# Patient Record
Sex: Male | Born: 1945
Health system: Southern US, Community
[De-identification: ages and names within clinical notes are randomized; demographics above are authoritative.]

## PROBLEM LIST (undated history)

## (undated) DIAGNOSIS — H269 Unspecified cataract: Secondary | ICD-10-CM

## (undated) DIAGNOSIS — E042 Nontoxic multinodular goiter: Secondary | ICD-10-CM

## (undated) DIAGNOSIS — M25571 Pain in right ankle and joints of right foot: Secondary | ICD-10-CM

## (undated) DIAGNOSIS — K219 Gastro-esophageal reflux disease without esophagitis: Secondary | ICD-10-CM

## (undated) DIAGNOSIS — N138 Other obstructive and reflux uropathy: Secondary | ICD-10-CM

## (undated) DIAGNOSIS — M1990 Unspecified osteoarthritis, unspecified site: Secondary | ICD-10-CM

## (undated) DIAGNOSIS — R079 Chest pain, unspecified: Secondary | ICD-10-CM

## (undated) DIAGNOSIS — I10 Essential (primary) hypertension: Secondary | ICD-10-CM

## (undated) DIAGNOSIS — H409 Unspecified glaucoma: Secondary | ICD-10-CM

## (undated) DIAGNOSIS — E785 Hyperlipidemia, unspecified: Secondary | ICD-10-CM

## (undated) DIAGNOSIS — N400 Enlarged prostate without lower urinary tract symptoms: Secondary | ICD-10-CM

## (undated) DIAGNOSIS — H52209 Unspecified astigmatism, unspecified eye: Secondary | ICD-10-CM

## (undated) DIAGNOSIS — M542 Cervicalgia: Secondary | ICD-10-CM

## (undated) DIAGNOSIS — I639 Cerebral infarction, unspecified: Secondary | ICD-10-CM

## (undated) DIAGNOSIS — G459 Transient cerebral ischemic attack, unspecified: Secondary | ICD-10-CM

## (undated) DIAGNOSIS — M25519 Pain in unspecified shoulder: Secondary | ICD-10-CM

## (undated) DIAGNOSIS — K2100 Gastro-esophageal reflux disease with esophagitis, without bleeding: Secondary | ICD-10-CM

## (undated) DIAGNOSIS — E049 Nontoxic goiter, unspecified: Secondary | ICD-10-CM

## (undated) HISTORY — DX: Gastro-esophageal reflux disease with esophagitis, without bleeding: K21.00

## (undated) HISTORY — DX: Pain in unspecified shoulder: M25.519

## (undated) HISTORY — DX: Nontoxic multinodular goiter: E04.2

## (undated) HISTORY — DX: Essential (primary) hypertension: I10

## (undated) HISTORY — DX: Pain in right ankle and joints of right foot: M25.571

## (undated) HISTORY — DX: Gastro-esophageal reflux disease without esophagitis: K21.9

## (undated) HISTORY — DX: Hyperlipidemia, unspecified: E78.5

## (undated) HISTORY — PX: APPENDECTOMY: SHX54

## (undated) HISTORY — PX: DENTAL SURGERY: SHX609

## (undated) HISTORY — DX: Unspecified glaucoma: H40.9

## (undated) HISTORY — DX: Cervicalgia: M54.2

## (undated) HISTORY — DX: Other obstructive and reflux uropathy: N13.8

## (undated) HISTORY — DX: Unspecified osteoarthritis, unspecified site: M19.90

## (undated) HISTORY — DX: Transient cerebral ischemic attack, unspecified: G45.9

## (undated) HISTORY — DX: Chest pain, unspecified: R07.9

---

## 2011-11-16 DIAGNOSIS — H5200 Hypermetropia, unspecified eye: Secondary | ICD-10-CM | POA: Diagnosis not present

## 2011-11-16 DIAGNOSIS — H40019 Open angle with borderline findings, low risk, unspecified eye: Secondary | ICD-10-CM | POA: Diagnosis not present

## 2011-11-16 DIAGNOSIS — H25019 Cortical age-related cataract, unspecified eye: Secondary | ICD-10-CM | POA: Diagnosis not present

## 2011-11-16 DIAGNOSIS — H52229 Regular astigmatism, unspecified eye: Secondary | ICD-10-CM | POA: Diagnosis not present

## 2011-11-29 ENCOUNTER — Ambulatory Visit (HOSPITAL_COMMUNITY)
Admission: RE | Admit: 2011-11-29 | Discharge: 2011-11-29 | Disposition: A | Discharge status: Home / Self Care | Payer: Medicare Other | Source: Ambulatory Visit | Attending: Pulmonary Disease | Admitting: Pulmonary Disease

## 2011-11-29 ENCOUNTER — Other Ambulatory Visit (HOSPITAL_COMMUNITY): Payer: Self-pay | Admitting: Pulmonary Disease

## 2011-11-29 DIAGNOSIS — M25859 Other specified joint disorders, unspecified hip: Secondary | ICD-10-CM | POA: Diagnosis not present

## 2011-11-29 DIAGNOSIS — M25559 Pain in unspecified hip: Secondary | ICD-10-CM | POA: Insufficient documentation

## 2011-11-29 DIAGNOSIS — M545 Low back pain, unspecified: Secondary | ICD-10-CM | POA: Diagnosis not present

## 2011-11-29 DIAGNOSIS — M47817 Spondylosis without myelopathy or radiculopathy, lumbosacral region: Secondary | ICD-10-CM | POA: Diagnosis not present

## 2011-11-29 DIAGNOSIS — M519 Unspecified thoracic, thoracolumbar and lumbosacral intervertebral disc disorder: Secondary | ICD-10-CM | POA: Diagnosis not present

## 2011-11-29 DIAGNOSIS — M169 Osteoarthritis of hip, unspecified: Secondary | ICD-10-CM | POA: Insufficient documentation

## 2011-11-29 DIAGNOSIS — M1610 Unilateral primary osteoarthritis, unspecified hip: Secondary | ICD-10-CM | POA: Insufficient documentation

## 2011-11-29 DIAGNOSIS — M549 Dorsalgia, unspecified: Secondary | ICD-10-CM

## 2012-07-05 DIAGNOSIS — I10 Essential (primary) hypertension: Secondary | ICD-10-CM | POA: Diagnosis not present

## 2012-07-05 DIAGNOSIS — E785 Hyperlipidemia, unspecified: Secondary | ICD-10-CM | POA: Diagnosis not present

## 2012-07-05 DIAGNOSIS — Z0000 Encounter for general adult medical examination without abnormal findings: Secondary | ICD-10-CM | POA: Diagnosis not present

## 2012-07-05 DIAGNOSIS — Z125 Encounter for screening for malignant neoplasm of prostate: Secondary | ICD-10-CM | POA: Diagnosis not present

## 2012-11-27 ENCOUNTER — Other Ambulatory Visit: Payer: Self-pay

## 2012-11-27 ENCOUNTER — Telehealth: Payer: Self-pay

## 2012-11-27 ENCOUNTER — Encounter (HOSPITAL_COMMUNITY): Payer: Self-pay | Admitting: Pharmacy Technician

## 2012-11-27 DIAGNOSIS — Z1211 Encounter for screening for malignant neoplasm of colon: Secondary | ICD-10-CM

## 2012-11-27 NOTE — Telephone Encounter (Signed)
Gastroenterology Pre-Procedure Form     Request Date: 11/27/2012      Requesting Physician: Dr. Juanetta Gosling     PATIENT INFORMATION:  Hector Welch is a 67 y.o., male (DOB=[birth year]).  PROCEDURE: Procedure(s) requested: colonoscopy Procedure Reason: screening for colon cancer  PATIENT REVIEW QUESTIONS: The patient reports the following:   1. Diabetes Melitis: no 2. Joint replacements in the past 12 months: no 3. Major health problems in the past 3 months: no 4. Has an artificial valve or MVP:no 5. Has been advised in past to take antibiotics in advance of a procedure like teeth cleaning: no}    MEDICATIONS & ALLERGIES:    Patient reports the following regarding taking any blood thinners:   Plavix? no Aspirin? YES Coumadin?  no  Patient confirms/reports the following medications:  Current Outpatient Prescriptions  Medication Sig Dispense Refill  . aspirin 81 MG tablet Take 81 mg by mouth daily.      . nebivolol (BYSTOLIC) 10 MG tablet Take 10 mg by mouth daily.       No current facility-administered medications for this visit.    Patient confirms/reports the following allergies:  No Known Allergies  Patient is appropriate to schedule for requested procedure(s): yes  AUTHORIZATION INFORMATION Primary Insurance:   ID #:   Group #:  Pre-Cert / Auth required:  Pre-Cert / Auth #:   Secondary Insurance:   ID #: Group #:  Pre-Cert / Auth required:  Pre-Cert / Auth #:   No orders of the defined types were placed in this encounter.    SCHEDULE INFORMATION: Procedure has been scheduled as follows:  Date:12/05/2012             Time:  1:00 PM Location: [facility] Short Stay  This Gastroenterology Pre-Precedure Form is being routed to the following provider(s) for review: R. Roetta Sessions, MD

## 2012-11-27 NOTE — Telephone Encounter (Signed)
OK to proceed with colonoscopy.

## 2012-11-28 MED ORDER — PEG-KCL-NACL-NASULF-NA ASC-C 100 G PO SOLR
1.0000 | ORAL | Status: DC
Start: 2012-11-28 — End: 2018-02-27

## 2012-11-28 NOTE — Telephone Encounter (Signed)
Rx sent to [facility] in [place]. Instructions mailed to pt.  

## 2012-11-29 NOTE — Telephone Encounter (Signed)
OK to proceed with colonoscopy.

## 2012-12-05 ENCOUNTER — Ambulatory Visit (HOSPITAL_COMMUNITY)
Admission: RE | Admit: 2012-12-05 | Discharge: 2012-12-05 | Disposition: A | Discharge status: Home / Self Care | Payer: Medicare Other | Source: Ambulatory Visit | Attending: Internal Medicine | Admitting: Internal Medicine

## 2012-12-05 ENCOUNTER — Encounter (HOSPITAL_COMMUNITY)
Admission: RE | Disposition: A | Discharge status: Home / Self Care | Payer: Self-pay | Source: Ambulatory Visit | Attending: Internal Medicine

## 2012-12-05 ENCOUNTER — Encounter (HOSPITAL_COMMUNITY): Payer: Self-pay | Admitting: *Deleted

## 2012-12-05 DIAGNOSIS — K648 Other hemorrhoids: Secondary | ICD-10-CM | POA: Diagnosis not present

## 2012-12-05 DIAGNOSIS — I10 Essential (primary) hypertension: Secondary | ICD-10-CM | POA: Diagnosis not present

## 2012-12-05 DIAGNOSIS — Z1211 Encounter for screening for malignant neoplasm of colon: Secondary | ICD-10-CM

## 2012-12-05 HISTORY — DX: Unspecified astigmatism, unspecified eye: H52.209

## 2012-12-05 HISTORY — PX: COLONOSCOPY: SHX5424

## 2012-12-05 HISTORY — DX: Essential (primary) hypertension: I10

## 2012-12-05 LAB — HM COLONOSCOPY

## 2012-12-05 SURGERY — COLONOSCOPY
Anesthesia: Moderate Sedation

## 2012-12-05 MED ORDER — MIDAZOLAM HCL 5 MG/5ML IJ SOLN
INTRAMUSCULAR | Status: AC
  Filled 2012-12-05: qty 10

## 2012-12-05 MED ORDER — MEPERIDINE HCL 100 MG/ML IJ SOLN
INTRAMUSCULAR | Status: DC | PRN
  Administered 2012-12-05: 25 mg via INTRAVENOUS
  Administered 2012-12-05: 50 mg via INTRAVENOUS

## 2012-12-05 MED ORDER — ONDANSETRON HCL 4 MG/2ML IJ SOLN
INTRAMUSCULAR | Status: DC | PRN
  Administered 2012-12-05: 4 mg via INTRAVENOUS

## 2012-12-05 MED ORDER — STERILE WATER FOR IRRIGATION IR SOLN
Status: DC | PRN
  Administered 2012-12-05: 13:00:00

## 2012-12-05 MED ORDER — MIDAZOLAM HCL 5 MG/5ML IJ SOLN
INTRAMUSCULAR | Status: DC | PRN
  Administered 2012-12-05 (×2): 2 mg via INTRAVENOUS

## 2012-12-05 MED ORDER — SODIUM CHLORIDE 0.45 % IV SOLN
INTRAVENOUS | Status: DC
  Administered 2012-12-05: 20 mL/h via INTRAVENOUS

## 2012-12-05 MED ORDER — MEPERIDINE HCL 100 MG/ML IJ SOLN
INTRAMUSCULAR | Status: AC
  Filled 2012-12-05: qty 2

## 2012-12-05 MED ORDER — ONDANSETRON HCL 4 MG/2ML IJ SOLN
INTRAMUSCULAR | Status: AC
  Filled 2012-12-05: qty 2

## 2012-12-05 NOTE — H&P (Signed)
  Primary Care Physician:  Hector Maudlin, MD Primary Gastroenterologist:  Dr. Jena Welch  Pre-Procedure History & Physical: HPI:  Hector Welch. is a 67 y.o. male is here for a screening colonoscopy. No bowel symptoms. No prior colonoscopy. No family history of colon cancer or polyps.  Past Medical History  Diagnosis Date  . Hypertension   . Astigmatism     Past Surgical History  Procedure Laterality Date  . Appendectomy  [facility], 60's    Prior to Admission medications   Medication Sig Start Date End Date Taking? Authorizing Welch  aspirin 81 MG tablet Take 81 mg by mouth daily.   Yes Hector Provider, MD  nebivolol (BYSTOLIC) 10 MG tablet Take 10 mg by mouth daily.   Yes Hector Provider, MD  peg 3350 powder (MOVIPREP) 100 G SOLR Take 1 kit (100 g total) by mouth as directed. 11/28/12  Yes Hector Ade, MD    Allergies as of 11/27/2012  . (No Known Allergies)    History reviewed. No pertinent family history.  History   Social History  . Marital Status: Married    Spouse Name: N/A    Number of Children: N/A  . Years of Education: N/A   Occupational History  . Not on file.   Social History Main Topics  . Smoking status: Former Games developer  . Smokeless tobacco: Not on file  . Alcohol Use: No  . Drug Use: No  . Sexually Active: Not on file   Other Topics Concern  . Not on file   Social History Narrative  . No narrative on file    Review of Systems: See HPI, otherwise negative ROS  Physical Exam: BP 135/86  Pulse 59  Resp 16  Ht 5\' 10"  (1.778 m)  Wt 166 lb 8 oz (75.524 kg)  BMI 23.89 kg/m2  SpO2 100% General:   Alert,  Well-developed, well-nourished, pleasant and cooperative in NAD Head:  Normocephalic and atraumatic. Eyes:  Sclera clear, no icterus.   Conjunctiva pink. Ears:  Normal auditory acuity. Nose:  No deformity, discharge,  or lesions. Mouth:  No deformity or lesions, dentition normal. Neck:  Supple; no masses or thyromegaly. Lungs:   Clear throughout to auscultation.   No wheezes, crackles, or rhonchi. No acute distress. Heart:  Regular rate and rhythm; no murmurs, clicks, rubs,  or gallops. Abdomen:  Soft, nontender and nondistended. No masses, hepatosplenomegaly or hernias noted. Normal bowel sounds, without guarding, and without rebound.   Msk:  Symmetrical without gross deformities. Normal posture. Pulses:  Normal pulses noted. Extremities:  Without clubbing or edema. Neurologic:  Alert and  oriented x4;  grossly normal neurologically. Skin:  Intact without significant lesions or rashes. Cervical Nodes:  No significant cervical adenopathy. Psych:  Alert and cooperative. Normal mood and affect.  Impression/Plan: Hector Welch. is now here to undergo a screening colonoscopy.  First ever average risk screening examination.  Risks, benefits, limitations, imponderables and alternatives regarding colonoscopy have been reviewed with the patient. Questions have been answered. All parties agreeable.

## 2012-12-05 NOTE — Op Note (Signed)
[facility] [address] [place], [address]   COLONOSCOPY PROCEDURE REPORT  PATIENT: Hector, Welch  MR#:         [id number] BIRTHDATE: 1945-12-21 , 66  yrs. old GENDER: Male ENDOSCOPIST: R.  Roetta Sessions, MD FACP FACG REFERRED BY:  Kari Baars, M.D. PROCEDURE DATE:  12/05/2012 PROCEDURE:     Screening colonoscopy  INDICATIONS: First-ever average risk screening examination  INFORMED CONSENT:  The risks, benefits, alternatives and imponderables including but not limited to bleeding, perforation as well as the possibility of a missed lesion have been reviewed.  The potential for biopsy, lesion removal, etc. have also been discussed.  Questions have been answered.  All parties agreeable. Please see the history and physical in the medical record for more information.  MEDICATIONS: Versed 4 mg IV and Demerol 75 mg IV in divided doses. Zofran 4 mg IV  DESCRIPTION OF PROCEDURE:  After a digital rectal exam was performed, the Pentax Colonoscope [id number]  colonoscope was advanced from the anus through the rectum and colon to the area of the cecum, ileocecal valve and appendiceal orifice.  The cecum was deeply intubated.  These structures were well-seen and photographed for the record.  From the level of the cecum and ileocecal valve, the scope was slowly and cautiously withdrawn.  The mucosal surfaces were carefully surveyed utilizing scope tip deflection to facilitate fold flattening as needed.  The scope was pulled down into the rectum where a thorough examination including retroflexion was performed.    FINDINGS:  Adequate preparation. Internal hemorrhoids ;otherwise, normal rectum. Normal-appearing colonic mucosa.  abnormal distal 10 cm of terminal ileal mucosa  THERAPEUTIC / DIAGNOSTIC MANEUVERS PERFORMED:  none  COMPLICATIONS: none  CECAL WITHDRAWAL TIME:  8 minutes  IMPRESSION:  Normal rectum, colon and terminal ileum  RECOMMENDATIONS: Repeat  screening colonoscopy in 10 years   _______________________________ eSigned:  R. Roetta Sessions, MD FACP [facility] 12/05/2012 1:48 PM   CC:

## 2012-12-07 ENCOUNTER — Encounter (HOSPITAL_COMMUNITY): Payer: Self-pay | Admitting: Internal Medicine

## 2012-12-29 ENCOUNTER — Encounter: Payer: Self-pay | Admitting: Internal Medicine

## 2013-05-31 ENCOUNTER — Telehealth: Payer: Self-pay

## 2013-05-31 NOTE — Telephone Encounter (Signed)
Error

## 2014-03-08 DIAGNOSIS — E785 Hyperlipidemia, unspecified: Secondary | ICD-10-CM | POA: Diagnosis not present

## 2014-03-08 DIAGNOSIS — K2100 Gastro-esophageal reflux disease with esophagitis, without bleeding: Secondary | ICD-10-CM | POA: Diagnosis not present

## 2014-03-08 DIAGNOSIS — I10 Essential (primary) hypertension: Secondary | ICD-10-CM | POA: Diagnosis not present

## 2014-03-08 DIAGNOSIS — Z1211 Encounter for screening for malignant neoplasm of colon: Secondary | ICD-10-CM | POA: Diagnosis not present

## 2014-03-08 DIAGNOSIS — Z0000 Encounter for general adult medical examination without abnormal findings: Secondary | ICD-10-CM | POA: Diagnosis not present

## 2014-03-08 DIAGNOSIS — Z125 Encounter for screening for malignant neoplasm of prostate: Secondary | ICD-10-CM | POA: Diagnosis not present

## 2014-07-16 DIAGNOSIS — N400 Enlarged prostate without lower urinary tract symptoms: Secondary | ICD-10-CM | POA: Diagnosis not present

## 2014-07-16 DIAGNOSIS — Z79899 Other long term (current) drug therapy: Secondary | ICD-10-CM | POA: Diagnosis not present

## 2014-07-16 DIAGNOSIS — E785 Hyperlipidemia, unspecified: Secondary | ICD-10-CM | POA: Diagnosis not present

## 2014-07-16 DIAGNOSIS — I10 Essential (primary) hypertension: Secondary | ICD-10-CM | POA: Diagnosis not present

## 2014-12-27 DIAGNOSIS — H4011X2 Primary open-angle glaucoma, moderate stage: Secondary | ICD-10-CM | POA: Diagnosis not present

## 2014-12-27 DIAGNOSIS — H52223 Regular astigmatism, bilateral: Secondary | ICD-10-CM | POA: Diagnosis not present

## 2014-12-27 DIAGNOSIS — H524 Presbyopia: Secondary | ICD-10-CM | POA: Diagnosis not present

## 2014-12-27 DIAGNOSIS — H5203 Hypermetropia, bilateral: Secondary | ICD-10-CM | POA: Diagnosis not present

## 2015-01-14 DIAGNOSIS — K210 Gastro-esophageal reflux disease with esophagitis: Secondary | ICD-10-CM | POA: Diagnosis not present

## 2015-01-14 DIAGNOSIS — N138 Other obstructive and reflux uropathy: Secondary | ICD-10-CM | POA: Diagnosis not present

## 2015-01-14 DIAGNOSIS — I10 Essential (primary) hypertension: Secondary | ICD-10-CM | POA: Diagnosis not present

## 2015-02-17 DIAGNOSIS — H25813 Combined forms of age-related cataract, bilateral: Secondary | ICD-10-CM | POA: Diagnosis not present

## 2015-02-17 DIAGNOSIS — H40023 Open angle with borderline findings, high risk, bilateral: Secondary | ICD-10-CM | POA: Diagnosis not present

## 2015-03-31 DIAGNOSIS — H4011X2 Primary open-angle glaucoma, moderate stage: Secondary | ICD-10-CM | POA: Diagnosis not present

## 2015-03-31 DIAGNOSIS — H4011X1 Primary open-angle glaucoma, mild stage: Secondary | ICD-10-CM | POA: Diagnosis not present

## 2015-04-24 DIAGNOSIS — H409 Unspecified glaucoma: Secondary | ICD-10-CM | POA: Diagnosis not present

## 2015-04-24 DIAGNOSIS — K210 Gastro-esophageal reflux disease with esophagitis: Secondary | ICD-10-CM | POA: Diagnosis not present

## 2015-04-24 DIAGNOSIS — N138 Other obstructive and reflux uropathy: Secondary | ICD-10-CM | POA: Diagnosis not present

## 2015-04-24 DIAGNOSIS — I10 Essential (primary) hypertension: Secondary | ICD-10-CM | POA: Diagnosis not present

## 2015-04-24 DIAGNOSIS — E785 Hyperlipidemia, unspecified: Secondary | ICD-10-CM | POA: Diagnosis not present

## 2015-04-24 DIAGNOSIS — Z0000 Encounter for general adult medical examination without abnormal findings: Secondary | ICD-10-CM | POA: Diagnosis not present

## 2015-04-24 DIAGNOSIS — Z1211 Encounter for screening for malignant neoplasm of colon: Secondary | ICD-10-CM | POA: Diagnosis not present

## 2015-05-27 DIAGNOSIS — H4011X1 Primary open-angle glaucoma, mild stage: Secondary | ICD-10-CM | POA: Diagnosis not present

## 2015-05-27 DIAGNOSIS — H4011X2 Primary open-angle glaucoma, moderate stage: Secondary | ICD-10-CM | POA: Diagnosis not present

## 2016-04-26 DIAGNOSIS — Z1211 Encounter for screening for malignant neoplasm of colon: Secondary | ICD-10-CM | POA: Diagnosis not present

## 2016-04-26 DIAGNOSIS — I10 Essential (primary) hypertension: Secondary | ICD-10-CM | POA: Diagnosis not present

## 2016-04-26 DIAGNOSIS — E785 Hyperlipidemia, unspecified: Secondary | ICD-10-CM | POA: Diagnosis not present

## 2016-04-26 DIAGNOSIS — N138 Other obstructive and reflux uropathy: Secondary | ICD-10-CM | POA: Diagnosis not present

## 2016-04-26 DIAGNOSIS — H409 Unspecified glaucoma: Secondary | ICD-10-CM | POA: Diagnosis not present

## 2016-04-26 DIAGNOSIS — K210 Gastro-esophageal reflux disease with esophagitis: Secondary | ICD-10-CM | POA: Diagnosis not present

## 2016-04-26 DIAGNOSIS — Z125 Encounter for screening for malignant neoplasm of prostate: Secondary | ICD-10-CM | POA: Diagnosis not present

## 2016-04-26 DIAGNOSIS — Z0000 Encounter for general adult medical examination without abnormal findings: Secondary | ICD-10-CM | POA: Diagnosis not present

## 2016-05-06 DIAGNOSIS — Z1211 Encounter for screening for malignant neoplasm of colon: Secondary | ICD-10-CM | POA: Diagnosis not present

## 2016-05-10 DIAGNOSIS — L821 Other seborrheic keratosis: Secondary | ICD-10-CM | POA: Diagnosis not present

## 2016-05-10 DIAGNOSIS — L820 Inflamed seborrheic keratosis: Secondary | ICD-10-CM | POA: Diagnosis not present

## 2017-04-27 DIAGNOSIS — K210 Gastro-esophageal reflux disease with esophagitis: Secondary | ICD-10-CM | POA: Diagnosis not present

## 2017-04-27 DIAGNOSIS — E785 Hyperlipidemia, unspecified: Secondary | ICD-10-CM | POA: Diagnosis not present

## 2017-04-27 DIAGNOSIS — Z125 Encounter for screening for malignant neoplasm of prostate: Secondary | ICD-10-CM | POA: Diagnosis not present

## 2017-04-27 DIAGNOSIS — N138 Other obstructive and reflux uropathy: Secondary | ICD-10-CM | POA: Diagnosis not present

## 2017-04-27 DIAGNOSIS — I10 Essential (primary) hypertension: Secondary | ICD-10-CM | POA: Diagnosis not present

## 2017-04-27 DIAGNOSIS — Z0000 Encounter for general adult medical examination without abnormal findings: Secondary | ICD-10-CM | POA: Diagnosis not present

## 2017-04-27 DIAGNOSIS — H409 Unspecified glaucoma: Secondary | ICD-10-CM | POA: Diagnosis not present

## 2017-04-27 LAB — HM HEPATITIS C SCREENING LAB: HM Hepatitis Screen: NEGATIVE

## 2017-05-09 ENCOUNTER — Other Ambulatory Visit: Payer: Self-pay

## 2017-05-12 DIAGNOSIS — Z1211 Encounter for screening for malignant neoplasm of colon: Secondary | ICD-10-CM | POA: Diagnosis not present

## 2017-11-07 ENCOUNTER — Other Ambulatory Visit (HOSPITAL_COMMUNITY): Payer: Self-pay | Admitting: Pulmonary Disease

## 2017-11-07 DIAGNOSIS — G459 Transient cerebral ischemic attack, unspecified: Secondary | ICD-10-CM

## 2017-11-07 DIAGNOSIS — I10 Essential (primary) hypertension: Secondary | ICD-10-CM | POA: Diagnosis not present

## 2017-11-16 ENCOUNTER — Ambulatory Visit (HOSPITAL_COMMUNITY)
Admission: RE | Admit: 2017-11-16 | Discharge: 2017-11-16 | Disposition: A | Discharge status: Home / Self Care | Payer: Medicare Other | Source: Ambulatory Visit | Attending: Pulmonary Disease | Admitting: Pulmonary Disease

## 2017-11-16 DIAGNOSIS — G459 Transient cerebral ischemic attack, unspecified: Secondary | ICD-10-CM | POA: Insufficient documentation

## 2017-11-16 DIAGNOSIS — R42 Dizziness and giddiness: Secondary | ICD-10-CM | POA: Diagnosis not present

## 2017-11-16 DIAGNOSIS — I6523 Occlusion and stenosis of bilateral carotid arteries: Secondary | ICD-10-CM | POA: Diagnosis not present

## 2017-11-16 DIAGNOSIS — I083 Combined rheumatic disorders of mitral, aortic and tricuspid valves: Secondary | ICD-10-CM | POA: Insufficient documentation

## 2017-11-16 LAB — ECHOCARDIOGRAM COMPLETE
CHL CUP DOP CALC LVOT VTI: 20.6 cm
CHL CUP MV DEC (S): 165
CHL CUP RV SYS PRESS: 26 mmHg
CHL CUP STROKE VOLUME: 38 mL
CHL CUP TV REG PEAK VELOCITY: 242 cm/s
E decel time: 165 msec
E/e' ratio: 9.53
FS: 41 % (ref 28–44)
IVS/LV PW RATIO, ED: 1.01
LA diam end sys: 41 mm
LA diam index: 2.12 cm/m2
LA vol A4C: 82 ml
LA vol: 78.3 mL
LASIZE: 41 mm
LAVOLIN: 40.4 mL/m2
LDCA: 3.46 cm2
LV E/e' medial: 9.53
LV E/e'average: 9.53
LV TDI E'LATERAL: 8.81
LV dias vol index: 34 mL/m2
LV dias vol: 65 mL (ref 62–150)
LV sys vol index: 14 mL/m2
LV sys vol: 27 mL
LVELAT: 8.81 cm/s
LVOT diameter: 21 mm
LVOT peak grad rest: 4 mmHg
LVOTPV: 94.1 cm/s
LVOTSV: 71 mL
MV pk A vel: 61.4 m/s
MV pk E vel: 84 m/s
MVPG: 3 mmHg
PW: 10.3 mm — AB (ref 0.6–1.1)
RV LATERAL S' VELOCITY: 12.4 cm/s
RV TAPSE: 17.9 mm
Simpson's disk: 59
TDI e' medial: 10.6
TR max vel: 242 cm/s

## 2017-11-16 NOTE — Progress Notes (Signed)
*  PRELIMINARY RESULTS* Echocardiogram 2D Echocardiogram has been performed.  Stacey DrainWhite, Lurlene Ronda J 11/16/2017, 9:15 AM

## 2017-11-28 ENCOUNTER — Encounter: Payer: Self-pay | Admitting: *Deleted

## 2017-11-29 ENCOUNTER — Encounter: Payer: Self-pay | Admitting: Neurology

## 2017-11-29 ENCOUNTER — Ambulatory Visit (INDEPENDENT_AMBULATORY_CARE_PROVIDER_SITE_OTHER): Payer: Medicare Other | Admitting: Neurology

## 2017-11-29 ENCOUNTER — Other Ambulatory Visit: Payer: Self-pay

## 2017-11-29 VITALS — BP 151/88 | HR 49 | Ht 70.0 in | Wt 167.5 lb

## 2017-11-29 DIAGNOSIS — G4489 Other headache syndrome: Secondary | ICD-10-CM

## 2017-11-29 DIAGNOSIS — I480 Paroxysmal atrial fibrillation: Secondary | ICD-10-CM | POA: Diagnosis not present

## 2017-11-29 DIAGNOSIS — G459 Transient cerebral ischemic attack, unspecified: Secondary | ICD-10-CM | POA: Diagnosis not present

## 2017-11-29 DIAGNOSIS — Z5181 Encounter for therapeutic drug level monitoring: Secondary | ICD-10-CM | POA: Diagnosis not present

## 2017-11-29 NOTE — Patient Instructions (Signed)
   We will get a CT angiogram of the head and neck and get a heart monitor study.

## 2017-11-29 NOTE — Progress Notes (Signed)
Reason for visit: TIA events  Referring physician: Dr. Doristine Locks. is a 72 y.o. male  History of present illness:  [name] is a 72 year old right-handed black male with a history of hypertension.  The patient has started having episodes within the last 6 weeks that could be consistent with TIA events.  Six weeks ago, the patient had an episode of numbness in both legs, weakness in the legs, and difficulty with balance lasted 3 or 4 minutes.  Three or 4-weeks ago the patient had an event while driving a car when he started having blurring of vision and oscillopsia, he never had dimming of vision or loss of vision.  The patient was noted to have some alteration in speech, the speech was somewhat confused.  Over the last 6 weeks to 2 months the patient has had episodic headaches.  The headaches are fairly frequent, the patient denies any prior history of headaches earlier in his life.  The patient has had some slight weight loss recently.  He denies any fevers or chills.  The patient has had a third episode 2 weeks ago again with bilateral leg numbness and weakness, gait instability lasting only a minute or 2.  The patient has had full resolution of symptoms after each event.  The patient has had headaches associated with each event.  The patient reports no problems with neck pain with exception that he does have some slight discomfort in the right side of the neck.  The headaches may be in the back of the head or on either side right or left.  He does have some prostate issues, otherwise no problems controlling the bladder.  He has not had any syncope.  He does report some occasional palpitations of the heart, particularly if he stoops over to pick up something.  He has undergone a carotid Doppler study, 2D echocardiogram, and a CT of the brain that have been unrevealing.  Past Medical History:  Diagnosis Date  . Astigmatism   . Cervicalgia   . GERD (gastroesophageal reflux  disease)   . Glaucoma   . Hypertension   . Osteoarthritis     Past Surgical History:  Procedure Laterality Date  . APPENDECTOMY  [facility], 60's  . COLONOSCOPY N/A 12/05/2012   Procedure: COLONOSCOPY;  Surgeon: Daneil Dolin, MD;  Location: [facility];  Service: Endoscopy;  Laterality: N/A;  1:00 PM    History reviewed. No pertinent family history.  Social history:  reports that he has quit smoking. he has never used smokeless tobacco. He reports that he does not drink alcohol or use drugs.  Medications:  Prior to Admission medications   Medication Sig Start Date End Date Taking? Authorizing Provider  aspirin 81 MG tablet Take 81 mg by mouth daily.    [provider]  atorvastatin (LIPITOR) 20 MG tablet Take 20 mg by mouth daily.    [provider]  nebivolol (BYSTOLIC) 10 MG tablet Take 10 mg by mouth daily.    [provider]  peg 3350 powder (MOVIPREP) 100 G SOLR Take 1 kit (100 g total) by mouth as directed. 11/28/12   Rourk, Cristopher Estimable, MD  tamsulosin (FLOMAX) 0.4 MG CAPS capsule Take 0.8 mg by mouth daily.    [provider]     No Known Allergies  ROS:  Out of a complete 14 system review of symptoms, the patient complains only of the following symptoms, and all other reviewed systems are negative.  Weight loss, fatigue Chest pain, palpitations of the heart Blurred vision Snoring Confusion, headache, numbness, weakness, difficulty swallowing, dizziness Urination problems  Blood pressure (!) 151/88, pulse (!) 49, height 5' 10"  (1.778 m), weight 167 lb 8 oz (76 kg).  Physical Exam  General: The patient is alert and cooperative at the time of the examination.  Eyes: Pupils are equal, round, and reactive to light. Discs are flat bilaterally.  Neck: The neck is supple, no carotid bruits are noted.  Respiratory: The respiratory examination is clear.  Cardiovascular: The cardiovascular examination reveals a regular rate and rhythm, no  obvious murmurs or rubs are noted.  Skin: Extremities are without significant edema.  Neurologic Exam  Mental status: The patient is alert and oriented x 3 at the time of the examination. The patient has apparent normal recent and remote memory, with an apparently normal attention span and concentration ability.  Cranial nerves: Facial symmetry is present. There is good sensation of the face to pinprick and soft touch bilaterally. The strength of the facial muscles and the muscles to head turning and shoulder shrug are normal bilaterally. Speech is well enunciated, no aphasia or dysarthria is noted. Extraocular movements are full. Visual fields are full. The tongue is midline, and the patient has symmetric elevation of the soft palate. No obvious hearing deficits are noted.  Motor: The motor testing reveals 5 over 5 strength of all 4 extremities. Good symmetric motor tone is noted throughout.  Sensory: Sensory testing is intact to pinprick, soft touch, vibration sensation, and position sense on all 4 extremities. No evidence of extinction is noted.  Coordination: Cerebellar testing reveals good finger-nose-finger and heel-to-shin bilaterally.  Gait and station: Gait is normal. Tandem gait is normal. Romberg is negative. No drift is seen.  Reflexes: Deep tendon reflexes are symmetric and normal bilaterally. Toes are downgoing bilaterally.   2D echo 11/16/17:  Impressions:  - Upper normal LV wall thickness with LVEF 55-60% and normal   diastolic function. Mild to moderate left atrial enlargement.   Mild mitral regurgitation. Mildly sclerotic aortic valve. Mild   tricuspid regurgitation with estimated PASP 26 mmHg.   CT head 11/16/17:  IMPRESSION: Normal head CT.  * CT scan images were reviewed online. I agree with the written report.   Carotid doppler 11/16/17:  IMPRESSION: Moderate amount of bilateral atherosclerotic plaque, right subjectively greater than left, not resulting  in a hemodynamically significant stenosis within either internal carotid artery.   Assessment/Plan:  1.  Episodes of bilateral leg weakness and numbness, oscillopsia  2.  Headache, new onset  The patient will need further evaluation of the above events.  The patient will be set up for blood work today, he will have CT angiogram evaluation of the head and neck.  Vertebrobasilar insufficiency does need to be excluded.  The patient will have a 30-day cardiac monitor study.  He will remain on aspirin for now, he will follow-up in 3 months.  I have indicated that if he has a prolonged event lasting greater than 15 minutes, he is to go to the emergency room.  Jill Alexanders MD 11/29/2017 1:27 PM  [facility] [address] [place], [place] [address]  Phone [phone] Fax [phone]

## 2017-11-30 ENCOUNTER — Telehealth: Payer: Self-pay | Admitting: Neurology

## 2017-11-30 ENCOUNTER — Telehealth: Payer: Self-pay | Admitting: *Deleted

## 2017-11-30 LAB — CBC WITH DIFFERENTIAL/PLATELET
Basophils Absolute: 0 10*3/uL (ref 0.0–0.2)
Basos: 0 %
EOS (ABSOLUTE): 0 10*3/uL (ref 0.0–0.4)
EOS: 0 %
HEMATOCRIT: 40.7 % (ref 37.5–51.0)
Hemoglobin: 13.7 g/dL (ref 13.0–17.7)
IMMATURE GRANS (ABS): 0 10*3/uL (ref 0.0–0.1)
Immature Granulocytes: 0 %
LYMPHS ABS: 1.6 10*3/uL (ref 0.7–3.1)
LYMPHS: 34 %
MCH: 32.6 pg (ref 26.6–33.0)
MCHC: 33.7 g/dL (ref 31.5–35.7)
MCV: 97 fL (ref 79–97)
Monocytes Absolute: 0.2 10*3/uL (ref 0.1–0.9)
Monocytes: 5 %
NEUTROS ABS: 2.8 10*3/uL (ref 1.4–7.0)
Neutrophils: 61 %
Platelets: 150 10*3/uL (ref 150–379)
RBC: 4.2 x10E6/uL (ref 4.14–5.80)
RDW: 13.5 % (ref 12.3–15.4)
WBC: 4.6 10*3/uL (ref 3.4–10.8)

## 2017-11-30 LAB — SEDIMENTATION RATE: Sed Rate: 5 mm/hr (ref 0–30)

## 2017-11-30 LAB — COMPREHENSIVE METABOLIC PANEL
ALBUMIN: 4 g/dL (ref 3.5–4.8)
ALT: 12 IU/L (ref 0–44)
AST: 20 IU/L (ref 0–40)
Albumin/Globulin Ratio: 1.3 (ref 1.2–2.2)
Alkaline Phosphatase: 69 IU/L (ref 39–117)
BUN / CREAT RATIO: 7 — AB (ref 10–24)
BUN: 9 mg/dL (ref 8–27)
Bilirubin Total: 1 mg/dL (ref 0.0–1.2)
CALCIUM: 9.4 mg/dL (ref 8.6–10.2)
CO2: 23 mmol/L (ref 20–29)
CREATININE: 1.25 mg/dL (ref 0.76–1.27)
Chloride: 106 mmol/L (ref 96–106)
GFR, EST AFRICAN AMERICAN: 66 mL/min/{1.73_m2} (ref >59)
GFR, EST NON AFRICAN AMERICAN: 57 mL/min/{1.73_m2} — AB (ref >59)
Globulin, Total: 3.1 g/dL (ref 1.5–4.5)
Glucose: 101 mg/dL — ABNORMAL HIGH (ref 65–99)
Potassium: 4.4 mmol/L (ref 3.5–5.2)
Sodium: 142 mmol/L (ref 134–144)
TOTAL PROTEIN: 7.1 g/dL (ref 6.0–8.5)

## 2017-11-30 LAB — RPR: RPR Ser Ql: NONREACTIVE

## 2017-11-30 LAB — C-REACTIVE PROTEIN: CRP: <0.3 mg/L (ref 0.0–4.9)

## 2017-11-30 NOTE — Telephone Encounter (Signed)
-----   Message from York Spanielharles K Willis, MD sent at 11/30/2017  8:13 AM EST -----   The blood work results are unremarkable. Please call the patient.  ----- Message ----- From: Nell RangeInterface, [facility] Lab Results In Sent: 11/30/2017   7:43 AM To: York Spanielharles K Willis, MD

## 2017-11-30 NOTE — Telephone Encounter (Signed)
Jasmine DecemberSharon Will call and get patient scheduled for his apt.  CARDIAC EVENT MONITOR 30 day . [phone].   I Talked to [place] Hills[place]aron.

## 2017-11-30 NOTE — Telephone Encounter (Signed)
Noted, thank you

## 2017-11-30 NOTE — Telephone Encounter (Signed)
Tried home number. BM for wife, VM full and unable to leave message. Tried cell (VM for patient). LVM for him to call about results.  Okay to inform him labs unremarkable if he calls, thank you

## 2017-12-05 NOTE — Telephone Encounter (Signed)
Called and spoke with wife (on [place]) about unremarkable labs per CW,MD note. Wife verbalized understanding and will let husband know.

## 2017-12-08 ENCOUNTER — Ambulatory Visit (INDEPENDENT_AMBULATORY_CARE_PROVIDER_SITE_OTHER): Payer: Medicare Other

## 2017-12-08 DIAGNOSIS — G459 Transient cerebral ischemic attack, unspecified: Secondary | ICD-10-CM | POA: Diagnosis not present

## 2017-12-08 DIAGNOSIS — G4489 Other headache syndrome: Secondary | ICD-10-CM | POA: Diagnosis not present

## 2017-12-08 DIAGNOSIS — I480 Paroxysmal atrial fibrillation: Secondary | ICD-10-CM | POA: Diagnosis not present

## 2017-12-16 ENCOUNTER — Telehealth: Payer: Self-pay | Admitting: Neurology

## 2017-12-16 ENCOUNTER — Ambulatory Visit
Admission: RE | Admit: 2017-12-16 | Discharge: 2017-12-16 | Disposition: A | Discharge status: Home / Self Care | Payer: Medicare Other | Source: Ambulatory Visit | Attending: Neurology | Admitting: Neurology

## 2017-12-16 DIAGNOSIS — G459 Transient cerebral ischemic attack, unspecified: Secondary | ICD-10-CM | POA: Diagnosis not present

## 2017-12-16 DIAGNOSIS — G4489 Other headache syndrome: Secondary | ICD-10-CM

## 2017-12-16 DIAGNOSIS — I639 Cerebral infarction, unspecified: Secondary | ICD-10-CM

## 2017-12-16 DIAGNOSIS — I6523 Occlusion and stenosis of bilateral carotid arteries: Secondary | ICD-10-CM | POA: Diagnosis not present

## 2017-12-16 HISTORY — DX: Transient cerebral ischemic attack, unspecified: G45.9

## 2017-12-16 HISTORY — DX: Cerebral infarction, unspecified: I63.9

## 2017-12-16 MED ORDER — IOPAMIDOL (ISOVUE-370) INJECTION 76%
75.0000 mL | Freq: Once | INTRAVENOUS | Status: AC | PRN
  Administered 2017-12-16: 75 mL via INTRAVENOUS

## 2017-12-16 NOTE — Telephone Encounter (Signed)
I called the patient.  The CT angiogram does not show any surgically amenable problems, anterior circulation without significant stenosis, the left P2 segment as stenosis and could be a source of TIA events.  He is to stay on aspirin, we will be getting a cardiac monitor study.   CTA head and neck 12/16/17:  IMPRESSION: 1. Bilateral proximal ICA atherosclerosis but no hemodynamically significant carotid stenosis in the neck. Bilateral ICA siphon calcified plaque, with moderate Right ICA cavernous segment and mild Left ICA supraclinoid segment stenosis. 2. Otherwise negative anterior circulation. 3. Diminutive vertebrobasilar system, with no hemodynamically significant vertebral or basilar artery stenosis identified. The Left vertebral artery arises directly from the arch and is thread-like. 4. Fetal type bilateral PCA origins also appear to supply the distal basilar. There is severe stenosis of the Left PCA P2 segment, but otherwise normal bilateral PCA branches. 5. Stable since January and negative for age CT appearance of the brain. 6. Severe thyroid goiter extending into the mediastinum. Regional mass effect at the thoracic inlet and superior mediastinum but no airway narrowing. 7. Possible emphysema. Right upper lung scarring suspected.

## 2018-01-19 ENCOUNTER — Telehealth: Payer: Self-pay | Admitting: Neurology

## 2018-01-19 NOTE — Telephone Encounter (Signed)
I called the patient.  The cardiac monitor study is unremarkable.  Patient will remain on aspirin for now.  No indication for anticoagulation.    Cardiac monitor 01/18/18:  Sinus rhythm Rare premature ventricular contractions No sustained arrhythmias No atrial fibrillation

## 2018-02-27 ENCOUNTER — Other Ambulatory Visit: Payer: Self-pay

## 2018-02-27 ENCOUNTER — Encounter: Payer: Self-pay | Admitting: Neurology

## 2018-02-27 ENCOUNTER — Ambulatory Visit (INDEPENDENT_AMBULATORY_CARE_PROVIDER_SITE_OTHER): Payer: Medicare Other | Admitting: Neurology

## 2018-02-27 VITALS — BP 127/76 | HR 53 | Ht 70.0 in | Wt 167.5 lb

## 2018-02-27 DIAGNOSIS — G459 Transient cerebral ischemic attack, unspecified: Secondary | ICD-10-CM

## 2018-02-27 MED ORDER — TOPIRAMATE 25 MG PO TABS: ORAL_TABLET | ORAL | 3 refills | Status: DC

## 2018-02-27 NOTE — Progress Notes (Signed)
Reason for visit: TIA events  Hector Welch. is an 72 y.o. male  History of present illness:  Hector Welch is a 72 year old right-handed black male with a history of episodes of headache and leg weakness, slurred speech.  The patient underwent a CT angiogram of the head and neck, this revealed left P2 segment stenosis, otherwise no significant blockages were noted.  The patient has not had any further TIA type events but he is now having daily headaches that may be on the left or the right side, and may last minutes to up to half an hour or 45 minutes.  The patient remains on aspirin therapy.  He has had a cardiac monitor study that did not show evidence of atrial fibrillation.  He returns to this office for an evaluation.  Past Medical History:  Diagnosis Date  . Astigmatism   . Cervicalgia   . GERD (gastroesophageal reflux disease)   . Glaucoma   . Hypertension   . Osteoarthritis     Past Surgical History:  Procedure Laterality Date  . APPENDECTOMY  [facility], 60's  . COLONOSCOPY N/A 12/05/2012   Procedure: COLONOSCOPY;  Surgeon: Corbin Ade, MD;  Location: [facility];  Service: Endoscopy;  Laterality: N/A;  1:00 PM    Family History  Problem Relation Age of Onset  . Cancer Mother   . Cancer Father   . Cancer Sister   . Cancer Brother     Social history:  reports that he has quit smoking. He has never used smokeless tobacco. He reports that he does not drink alcohol or use drugs.   No Known Allergies  Medications:  Prior to Admission medications   Medication Sig Start Date End Date Taking? Authorizing Provider  aspirin 81 MG tablet Take 81 mg by mouth daily.   Yes [provider]  atorvastatin (LIPITOR) 20 MG tablet Take 20 mg by mouth daily.   Yes [provider]  nebivolol (BYSTOLIC) 10 MG tablet Take 10 mg by mouth daily.   Yes [provider]  tamsulosin (FLOMAX) 0.4 MG CAPS capsule Take 0.8 mg by mouth daily.   Yes [provider]    ROS:  Out of a complete 14 system review of symptoms, the patient complains only of the following symptoms, and all other reviewed systems are negative.  Headaches  Blood pressure 127/76, pulse (!) 53, height  (1.778 m), weight 167 lb 8 oz (76 kg).  Physical Exam  General: The patient is alert and cooperative at the time of the examination.  Skin: No significant peripheral edema is noted.   Neurologic Exam  Mental status: The patient is alert and oriented x 3 at the time of the examination. The patient has apparent normal recent and remote memory, with an apparently normal attention span and concentration ability.   Cranial nerves: Facial symmetry is present. Speech is normal, no aphasia or dysarthria is noted. Extraocular movements are full. Visual fields are full.  Motor: The patient has good strength in all 4 extremities.  Sensory examination: Soft touch sensation is symmetric on the face, arms, and legs.  Coordination: The patient has good finger-nose-finger and heel-to-shin bilaterally.  Gait and station: The patient has a normal gait. Tandem gait is normal. Romberg is negative. No drift is seen.  Reflexes: Deep tendon reflexes are symmetric.   CTA head and neck 12/16/17:  IMPRESSION: 1. Bilateral proximal ICA atherosclerosis but no hemodynamically significant carotid stenosis in the neck. Bilateral  ICA siphon calcified plaque, with moderate Right ICA cavernous segment and mild Left ICA supraclinoid segment stenosis. 2. Otherwise negative anterior circulation. 3. Diminutive vertebrobasilar system, with no hemodynamically significant vertebral or basilar artery stenosis identified. The Left vertebral artery arises directly from the arch and is thread-like. 4. Fetal type bilateral PCA origins also appear to supply the distal basilar. There is severe stenosis of the Left PCA P2 segment, but otherwise normal bilateral PCA branches. 5. Stable  since January and negative for age CT appearance of the brain. 6. Severe thyroid goiter extending into the mediastinum. Regional mass effect at the thoracic inlet and superior mediastinum but no airway narrowing. 7. Possible emphysema. Right upper lung scarring suspected.   Cardiac monitor 01/18/18:  Sinus rhythm Rare premature ventricular contractions No sustained arrhythmias No atrial fibrillation     Assessment/Plan:  1.  TIA type events  2.  Daily headaches  3.  Large thyroid goiter by CT  The patient will remain on aspirin therapy.  Given the daily headaches we will give a trial on Topamax in low-dose taking 25 mg at night for a week and then go to 50 mg at night.  The patient will call for any dose adjustments.  He will otherwise follow-up in 6 months.  Marlan Palau MD 02/27/2018 2:01 PM  [facility] [address] [place], [place] [address]  Phone [phone] Fax [phone]

## 2018-02-27 NOTE — Patient Instructions (Signed)
We will start Topamax for the headache.   Topamax (topiramate) is a seizure medication that has an FDA approval for seizures and for migraine headache. Potential side effects of this medication include weight loss, cognitive slowing, tingling in the fingers and toes, and carbonated drinks will taste bad. If any significant side effects are noted on this drug, please contact our office.  

## 2018-04-01 ENCOUNTER — Other Ambulatory Visit: Payer: Self-pay

## 2018-04-01 ENCOUNTER — Emergency Department (HOSPITAL_COMMUNITY)
Admission: EM | Admit: 2018-04-01 | Discharge: 2018-04-01 | Disposition: A | Discharge status: Home / Self Care | Payer: Medicare Other | Attending: Emergency Medicine | Admitting: Emergency Medicine

## 2018-04-01 ENCOUNTER — Emergency Department (HOSPITAL_COMMUNITY): Payer: Medicare Other

## 2018-04-01 ENCOUNTER — Encounter (HOSPITAL_COMMUNITY): Payer: Self-pay

## 2018-04-01 DIAGNOSIS — Z8673 Personal history of transient ischemic attack (TIA), and cerebral infarction without residual deficits: Secondary | ICD-10-CM | POA: Diagnosis not present

## 2018-04-01 DIAGNOSIS — Z79899 Other long term (current) drug therapy: Secondary | ICD-10-CM | POA: Diagnosis not present

## 2018-04-01 DIAGNOSIS — I10 Essential (primary) hypertension: Secondary | ICD-10-CM | POA: Insufficient documentation

## 2018-04-01 DIAGNOSIS — R0789 Other chest pain: Secondary | ICD-10-CM | POA: Diagnosis not present

## 2018-04-01 DIAGNOSIS — Z7982 Long term (current) use of aspirin: Secondary | ICD-10-CM | POA: Insufficient documentation

## 2018-04-01 DIAGNOSIS — Z87891 Personal history of nicotine dependence: Secondary | ICD-10-CM | POA: Insufficient documentation

## 2018-04-01 DIAGNOSIS — R079 Chest pain, unspecified: Secondary | ICD-10-CM | POA: Diagnosis not present

## 2018-04-01 HISTORY — DX: Cerebral infarction, unspecified: I63.9

## 2018-04-01 LAB — BASIC METABOLIC PANEL
Anion gap: 7 (ref 5–15)
BUN: 16 mg/dL (ref 6–20)
CALCIUM: 9 mg/dL (ref 8.9–10.3)
CO2: 26 mmol/L (ref 22–32)
CREATININE: 1.11 mg/dL (ref 0.61–1.24)
Chloride: 100 mmol/L — ABNORMAL LOW (ref 101–111)
GFR calc Af Amer: >60 mL/min (ref >60)
Glucose, Bld: 104 mg/dL — ABNORMAL HIGH (ref 65–99)
Potassium: 3.8 mmol/L (ref 3.5–5.1)
SODIUM: 133 mmol/L — AB (ref 135–145)

## 2018-04-01 LAB — CBC
HCT: 39.5 % (ref 39.0–52.0)
Hemoglobin: 12.8 g/dL — ABNORMAL LOW (ref 13.0–17.0)
MCH: 32.2 pg (ref 26.0–34.0)
MCHC: 32.4 g/dL (ref 30.0–36.0)
MCV: 99.2 fL (ref 78.0–100.0)
PLATELETS: 160 10*3/uL (ref 150–400)
RBC: 3.98 MIL/uL — ABNORMAL LOW (ref 4.22–5.81)
RDW: 12.3 % (ref 11.5–15.5)
WBC: 6.7 10*3/uL (ref 4.0–10.5)

## 2018-04-01 LAB — TROPONIN I

## 2018-04-01 LAB — D-DIMER, QUANTITATIVE (NOT AT ARMC): D DIMER QUANT: 0.95 ug{FEU}/mL — AB (ref 0.00–0.50)

## 2018-04-01 MED ORDER — PREDNISONE 20 MG PO TABS: ORAL_TABLET | ORAL | 0 refills | Status: DC

## 2018-04-01 MED ORDER — ACETAMINOPHEN 325 MG PO TABS
650.0000 mg | ORAL_TABLET | Freq: Once | ORAL | Status: AC
  Administered 2018-04-01: 650 mg via ORAL
  Filled 2018-04-01: qty 2

## 2018-04-01 MED ORDER — IOPAMIDOL (ISOVUE-370) INJECTION 76%
80.0000 mL | Freq: Once | INTRAVENOUS | Status: AC | PRN
  Administered 2018-04-01: 80 mL via INTRAVENOUS

## 2018-04-01 MED ORDER — TRAMADOL HCL 50 MG PO TABS
50.0000 mg | ORAL_TABLET | Freq: Once | ORAL | Status: AC
  Administered 2018-04-01: 50 mg via ORAL
  Filled 2018-04-01: qty 1

## 2018-04-01 MED ORDER — ALBUTEROL SULFATE HFA 108 (90 BASE) MCG/ACT IN AERS
1.0000 | INHALATION_SPRAY | RESPIRATORY_TRACT | Status: DC | PRN
  Administered 2018-04-01: 1 via RESPIRATORY_TRACT
  Filled 2018-04-01: qty 6.7

## 2018-04-01 MED ORDER — AZITHROMYCIN 250 MG PO TABS: 250.0000 mg | ORAL_TABLET | Freq: Every day | ORAL | 0 refills | Status: DC

## 2018-04-01 NOTE — ED Provider Notes (Signed)
[facility] EMERGENCY DEPARTMENT Provider Note   CSN: [id number] Arrival date & time: 04/01/18  1416     History   Chief Complaint Chief Complaint  Patient presents with  . Chest Pain    HPI Ellijah Welch. is a 72 y.o. male.  HPI Patient presents with left-sided chest pain that began at 10:00 this morning.  States the pain is worse with movement and deep breathing.  He has some mild associated shortness of breath.  Denies any known trauma.  No abdominal Pain, nausea or vomiting.  No cough, fever or chills.  Yesterday evening patient was experiencing right-sided neck pain especially with rotation of the neck.  He denies any focal weakness or numbness.  No new lower extremity swelling or pain.  No recent extended travel or immobilization. Past Medical History:  Diagnosis Date  . Astigmatism   . Cervicalgia   . GERD (gastroesophageal reflux disease)   . Glaucoma   . Hypertension   . Mini stroke (HCC) 12/2017  . Osteoarthritis     There are no active problems to display for this patient.   Past Surgical History:  Procedure Laterality Date  . APPENDECTOMY  APH, 60's  . COLONOSCOPY N/A 12/05/2012   Procedure: COLONOSCOPY;  Surgeon: Corbin Ade, MD;  Location: [facility] ENDO SUITE;  Service: Endoscopy;  Laterality: N/A;  1:00 PM        Home Medications    Prior to Admission medications   Medication Sig Start Date End Date Taking? Authorizing Provider  aspirin 325 MG tablet Take 325 mg by mouth daily.   Yes [provider]  atorvastatin (LIPITOR) 20 MG tablet Take 20 mg by mouth daily.   Yes [provider]  nebivolol (BYSTOLIC) 10 MG tablet Take 10 mg by mouth daily.   Yes [provider]  tamsulosin (FLOMAX) 0.4 MG CAPS capsule Take 0.8 mg by mouth daily.   Yes [provider]  topiramate (TOPAMAX) 25 MG tablet Take one tablet at night for one week, then take 2 tablets at night 02/27/18  Yes York Spaniel, MD  azithromycin  (ZITHROMAX) 250 MG tablet Take 1 tablet (250 mg total) by mouth daily. Take first 2 tablets together, then 1 every day until finished. 04/01/18   Loren Racer, MD  predniSONE (DELTASONE) 20 MG tablet 3 tabs po day one, then 2 po daily x 4 days 04/01/18   Loren Racer, MD    Family History Family History  Problem Relation Age of Onset  . Cancer Mother   . Cancer Father   . Cancer Sister   . Cancer Brother     Social History Social History   Tobacco Use  . Smoking status: Former Games developer  . Smokeless tobacco: Never Used  Substance Use Topics  . Alcohol use: No  . Drug use: No     Allergies   Patient has no known allergies.   Review of Systems Review of Systems  Constitutional: Negative for chills and fever.  HENT: Negative for trouble swallowing and voice change.   Eyes: Negative for visual disturbance.  Respiratory: Positive for shortness of breath. Negative for cough and wheezing.   Cardiovascular: Positive for chest pain. Negative for palpitations and leg swelling.  Gastrointestinal: Negative for abdominal pain, diarrhea, nausea and vomiting.  Genitourinary: Negative for dysuria, flank pain and frequency.  Musculoskeletal: Positive for myalgias and neck pain. Negative for back pain.  Skin: Negative for rash and wound.  Neurological: Negative for dizziness, syncope, weakness,  light-headedness, numbness and headaches.  All other systems reviewed and are negative.    Physical Exam Updated Vital Signs BP 123/85   Pulse (!) 51   Temp 98 F (36.7 C) (Oral)   Resp 14   Ht 5\' 10"  (1.778 m)   Wt 75.8 kg (167 lb)   SpO2 99%   BMI 23.96 kg/m   Physical Exam  Constitutional: He is oriented to person, place, and time. He appears well-developed and well-nourished. No distress.  HENT:  Head: Normocephalic and atraumatic.  Mouth/Throat: Oropharynx is clear and moist. No oropharyngeal exudate.  Eyes: Pupils are equal, round, and reactive to light. EOM are normal.    Neck: Normal range of motion. Neck supple. No JVD present.  Patient has some mild right-sided trapezius tenderness to palpation.  No meningismus.  Cardiovascular: Normal rate and regular rhythm. Exam reveals no gallop and no friction rub.  No murmur heard. Pulmonary/Chest: Effort normal and breath sounds normal. No stridor. No respiratory distress. He has no wheezes. He has no rales. He exhibits no tenderness.  Abdominal: Soft. Bowel sounds are normal. There is no tenderness. There is no rebound and no guarding.  Musculoskeletal: Normal range of motion. He exhibits no edema or tenderness.  Question mild right lower calf right calf right calf swelling compared to left.  No tenderness.  Distal pulses intact.  Lymphadenopathy:    He has no cervical adenopathy.  Neurological: He is alert and oriented to person, place, and time.  Moves all extremities without focal deficit.  Sensation fully intact.  Skin: Skin is warm and dry. Capillary refill takes less than 2 seconds. No rash noted. He is not diaphoretic. No erythema.  Psychiatric: He has a normal mood and affect. His behavior is normal.  Nursing note and vitals reviewed.    ED Treatments / Results  Labs (all labs ordered are listed, but only abnormal results are displayed) Labs Reviewed  BASIC METABOLIC PANEL - Abnormal; Notable for the following components:      Result Value   Sodium 133 (*)    Chloride 100 (*)    Glucose, Bld 104 (*)    All other components within normal limits  CBC - Abnormal; Notable for the following components:   RBC 3.98 (*)    Hemoglobin 12.8 (*)    All other components within normal limits  D-DIMER, QUANTITATIVE (NOT AT [facility]) - Abnormal; Notable for the following components:   D-Dimer, Quant 0.95 (*)    All other components within normal limits  TROPONIN I  TROPONIN I    EKG EKG Interpretation  Date/Time:  Saturday April 01 2018 14:28:05 EDT Ventricular Rate:  64 PR Interval:  94 QRS  Duration: 88 QT Interval:  400 QTC Calculation: 412 R Axis:   -9 Text Interpretation:  Sinus rhythm with short PR Moderate voltage criteria for LVH, may be normal variant Borderline ECG Confirmed by Loren RacerYelverton, Schuyler Behan ([id number]) on 04/01/2018 3:23:24 PM   Radiology Dg Chest 2 View  Result Date: 04/01/2018 CLINICAL DATA:  Chest pain EXAM: CHEST - 2 VIEW COMPARISON:  None. FINDINGS: Heart and mediastinal contours are within normal limits. No focal opacities or effusions. No acute bony abnormality. IMPRESSION: No active cardiopulmonary disease. Electronically Signed   By: Charlett NoseKevin  Dover M.D.   On: 04/01/2018 15:03   Ct Angio Chest Pe W And/or Wo Contrast  Result Date: 04/01/2018 CLINICAL DATA:  Left-sided chest pain starting this morning. Sharp pain has radiated to the right shoulder since last evening.  EXAM: CT ANGIOGRAPHY CHEST WITH CONTRAST TECHNIQUE: Multidetector CT imaging of the chest was performed using the standard protocol during bolus administration of intravenous contrast. Multiplanar CT image reconstructions and MIPs were obtained to evaluate the vascular anatomy. CONTRAST:  80mL ISOVUE-370 IOPAMIDOL (ISOVUE-370) INJECTION 76% COMPARISON:  CXR 04/01/2018 neck CT 12/16/2017 FINDINGS: Cardiovascular: Conventional branch pattern of the great vessels with atherosclerotic origins in its of the right brachiocephalic and subclavian arteries. Nonaneurysmal mild atherosclerosis of the thoracic aorta. Preferential opacification of the pulmonary arteries without pulmonary embolus to the segmental level. Left main and three-vessel coronary arteriosclerosis. Normal size heart without pericardial effusion or thickening. Mediastinum/Nodes: Stable substernal goiter with heterogeneous enhancement and calcifications extending into the superior mediastinum and precarinal region. This deviates the trachea and esophagus to the right as before. This measures up to 5.3 x 3.9 cm on series 5/77 and is without significant  interval change. Patent trachea and mainstem bronchi. No mediastinal, hilar nor axillary adenopathy. Lungs/Pleura: Bilateral lower lobe subpleural atelectasis and scarring are identified, right greater than left. Subpleural areas of tree-in-bud opacities are also present compatible with bronchiolitis. No pneumonia, effusion or pneumothorax. No dominant mass. Upper Abdomen: Bilateral extrarenal pelves with nonspecific perinephric fat stranding. No obstructive uropathy or nephrolithiasis. Normal bilateral adrenal glands, spleen, included pancreas and liver. Nondistended gallbladder is noted, free of stones. Musculoskeletal: Mid to lower thoracic spondylosis with degenerative disc disease and endplate spurring. Review of the MIP images confirms the above findings. IMPRESSION: 1. Redemonstration of large substernal goiter deviating the trachea and esophagus to the right. 2. Bibasilar subpleural atelectasis and scarring, right greater than left with superimposed tree-in-bud opacities compatible with bronchiolitis. 3. No acute pulmonary embolus. 4. Nonaneurysmal thoracic aorta. 5. Left main and three-vessel coronary arteriosclerosis. Aortic Atherosclerosis (ICD10-I70.0). Electronically Signed   By: Tollie Eth M.D.   On: 04/01/2018 17:35    Procedures Procedures (including critical care time)  Medications Ordered in ED Medications  albuterol (PROVENTIL HFA;VENTOLIN HFA) 108 (90 Base) MCG/ACT inhaler 1-2 puff (has no administration in time range)  iopamidol (ISOVUE-370) 76 % injection 80 mL (80 mLs Intravenous Contrast Given 04/01/18 1707)  traMADol (ULTRAM) tablet 50 mg (50 mg Oral Given 04/01/18 1935)  acetaminophen (TYLENOL) tablet 650 mg (650 mg Oral Given 04/01/18 1935)     Initial Impression / Assessment and Plan / ED Course  I have reviewed the triage vital signs and the nursing notes.  Pertinent labs & imaging results that were available during my care of the patient were reviewed by me and  considered in my medical decision making (see chart for details).    Patient's chest pain is very atypical for coronary artery disease.  No evidence of ischemia on EKG.  Troponin x2 is normal.  Patient did have an elevated d-dimer.  CT Angie of the chest without evidence of PE.  Does have what looks to be bronchiolitis.  Given pleuritic nature of his symptoms this may be the cause.  Unsure whether this is infectious in origin.  Will give Z-Pak and short course of steroids.  Patient understands the need to follow-up with cardiology and with his primary physician.  Strict return precautions have been given.   Final Clinical Impressions(s) / ED Diagnoses   Final diagnoses:  Atypical chest pain    ED Discharge Orders        Ordered    predniSONE (DELTASONE) 20 MG tablet     04/01/18 2051    azithromycin (ZITHROMAX) 250 MG tablet  Daily  04/01/18 2051       Loren Racer, MD 04/01/18 [phone]

## 2018-04-01 NOTE — ED Triage Notes (Signed)
Pt is having left sided chest pain that started this morning. Pain is sharp and has radiated to right shoulder last night. Denies N/V. Endorses weakness and sweating.

## 2018-04-06 ENCOUNTER — Other Ambulatory Visit (HOSPITAL_COMMUNITY): Payer: Self-pay | Admitting: Pulmonary Disease

## 2018-04-06 DIAGNOSIS — E049 Nontoxic goiter, unspecified: Secondary | ICD-10-CM

## 2018-04-06 DIAGNOSIS — N401 Enlarged prostate with lower urinary tract symptoms: Secondary | ICD-10-CM | POA: Diagnosis not present

## 2018-04-06 DIAGNOSIS — E785 Hyperlipidemia, unspecified: Secondary | ICD-10-CM | POA: Diagnosis not present

## 2018-04-06 DIAGNOSIS — I10 Essential (primary) hypertension: Secondary | ICD-10-CM | POA: Diagnosis not present

## 2018-04-06 DIAGNOSIS — R079 Chest pain, unspecified: Secondary | ICD-10-CM | POA: Diagnosis not present

## 2018-04-07 DIAGNOSIS — H401121 Primary open-angle glaucoma, left eye, mild stage: Secondary | ICD-10-CM | POA: Diagnosis not present

## 2018-04-07 DIAGNOSIS — H401112 Primary open-angle glaucoma, right eye, moderate stage: Secondary | ICD-10-CM | POA: Diagnosis not present

## 2018-04-11 ENCOUNTER — Ambulatory Visit (HOSPITAL_COMMUNITY)
Admission: RE | Admit: 2018-04-11 | Discharge: 2018-04-11 | Disposition: A | Discharge status: Home / Self Care | Payer: Medicare Other | Source: Ambulatory Visit | Attending: Pulmonary Disease | Admitting: Pulmonary Disease

## 2018-04-11 DIAGNOSIS — E049 Nontoxic goiter, unspecified: Secondary | ICD-10-CM

## 2018-04-11 DIAGNOSIS — E042 Nontoxic multinodular goiter: Secondary | ICD-10-CM | POA: Insufficient documentation

## 2018-04-28 DIAGNOSIS — Z0000 Encounter for general adult medical examination without abnormal findings: Secondary | ICD-10-CM | POA: Diagnosis not present

## 2018-05-01 DIAGNOSIS — N138 Other obstructive and reflux uropathy: Secondary | ICD-10-CM | POA: Diagnosis not present

## 2018-05-01 DIAGNOSIS — Z0000 Encounter for general adult medical examination without abnormal findings: Secondary | ICD-10-CM | POA: Diagnosis not present

## 2018-05-01 DIAGNOSIS — G459 Transient cerebral ischemic attack, unspecified: Secondary | ICD-10-CM | POA: Diagnosis not present

## 2018-05-01 DIAGNOSIS — R079 Chest pain, unspecified: Secondary | ICD-10-CM | POA: Diagnosis not present

## 2018-05-01 DIAGNOSIS — Z125 Encounter for screening for malignant neoplasm of prostate: Secondary | ICD-10-CM | POA: Diagnosis not present

## 2018-05-01 DIAGNOSIS — R739 Hyperglycemia, unspecified: Secondary | ICD-10-CM | POA: Diagnosis not present

## 2018-05-01 DIAGNOSIS — H409 Unspecified glaucoma: Secondary | ICD-10-CM | POA: Diagnosis not present

## 2018-05-01 DIAGNOSIS — E785 Hyperlipidemia, unspecified: Secondary | ICD-10-CM | POA: Diagnosis not present

## 2018-05-01 DIAGNOSIS — K210 Gastro-esophageal reflux disease with esophagitis: Secondary | ICD-10-CM | POA: Diagnosis not present

## 2018-05-01 DIAGNOSIS — I10 Essential (primary) hypertension: Secondary | ICD-10-CM | POA: Diagnosis not present

## 2018-05-03 DIAGNOSIS — E049 Nontoxic goiter, unspecified: Secondary | ICD-10-CM | POA: Diagnosis not present

## 2018-05-03 DIAGNOSIS — E042 Nontoxic multinodular goiter: Secondary | ICD-10-CM | POA: Diagnosis not present

## 2018-05-03 DIAGNOSIS — Z1211 Encounter for screening for malignant neoplasm of colon: Secondary | ICD-10-CM | POA: Diagnosis not present

## 2018-05-03 LAB — FECAL OCCULT BLOOD, GUAIAC: Fecal Occult Blood: NEGATIVE

## 2018-05-09 ENCOUNTER — Other Ambulatory Visit: Payer: Self-pay | Admitting: Surgery

## 2018-05-09 DIAGNOSIS — E042 Nontoxic multinodular goiter: Secondary | ICD-10-CM

## 2018-05-16 ENCOUNTER — Other Ambulatory Visit (HOSPITAL_COMMUNITY)
Admission: RE | Admit: 2018-05-16 | Discharge: 2018-05-16 | Disposition: A | Discharge status: Home / Self Care | Payer: Medicare Other | Source: Ambulatory Visit | Attending: Student | Admitting: Student

## 2018-05-16 ENCOUNTER — Ambulatory Visit
Admission: RE | Admit: 2018-05-16 | Discharge: 2018-05-16 | Disposition: A | Discharge status: Home / Self Care | Payer: Medicare Other | Source: Ambulatory Visit | Attending: Surgery | Admitting: Surgery

## 2018-05-16 DIAGNOSIS — E042 Nontoxic multinodular goiter: Secondary | ICD-10-CM | POA: Diagnosis not present

## 2018-05-16 DIAGNOSIS — E041 Nontoxic single thyroid nodule: Secondary | ICD-10-CM | POA: Diagnosis not present

## 2018-05-24 ENCOUNTER — Encounter: Payer: Self-pay | Admitting: Cardiology

## 2018-05-24 ENCOUNTER — Encounter: Payer: Self-pay | Admitting: *Deleted

## 2018-05-24 ENCOUNTER — Ambulatory Visit (INDEPENDENT_AMBULATORY_CARE_PROVIDER_SITE_OTHER): Payer: Medicare Other | Admitting: Cardiology

## 2018-05-24 VITALS — BP 144/90 | HR 55 | Ht 70.0 in | Wt 163.0 lb

## 2018-05-24 DIAGNOSIS — R079 Chest pain, unspecified: Secondary | ICD-10-CM

## 2018-05-24 DIAGNOSIS — Z136 Encounter for screening for cardiovascular disorders: Secondary | ICD-10-CM

## 2018-05-24 NOTE — Addendum Note (Signed)
Addended by: Eustace MooreANDERSON, Nafis Farnan M on: 05/24/2018 10:09 AM   Modules accepted: Orders

## 2018-05-24 NOTE — Progress Notes (Signed)
Clinical Summary [name] is a 72 y.o.male seen as new consult, referred by Dr Hector Welch for chest pain.  1. Chest pain - ER visit 03/2018 with chest pain. From ER notes was worst with deep breathing and position. Was having some positional neck pain at the time. Trops neg x2, EKG SR nonspecific inferior ST/T changes, CT PE negative. Some evidence of bronchiolitis, was given azithromycin and short steroid course. CT did show left main and 3 vessel CAD.  - Jan 2019 echo LVEF 55-60%, no WMAs  - can have chest pain at times. Aching pain midchest. Only occurs with bending. Worst with deep breathing. No other associated symptoms. Lasts 3-4 minutes.  - does regular yard work, tolerates without troubles.    CAD risk factors: HTN, HL, former tobacco x 10 years, TIA     AAA screen Past Medical History:  Diagnosis Date  . Astigmatism   . Cervicalgia   . GERD (gastroesophageal reflux disease)   . Glaucoma   . Hypertension   . Mini stroke (HCC) 12/2017  . Osteoarthritis      No Known Allergies   Current Outpatient Medications  Medication Sig Dispense Refill  . aspirin 325 MG tablet Take 325 mg by mouth daily.    Marland Kitchen. atorvastatin (LIPITOR) 20 MG tablet Take 20 mg by mouth daily.    Marland Kitchen. azithromycin (ZITHROMAX) 250 MG tablet Take 1 tablet (250 mg total) by mouth daily. Take first 2 tablets together, then 1 every day until finished. 6 tablet 0  . nebivolol (BYSTOLIC) 10 MG tablet Take 10 mg by mouth daily.    . predniSONE (DELTASONE) 20 MG tablet 3 tabs po day one, then 2 po daily x 4 days 11 tablet 0  . tamsulosin (FLOMAX) 0.4 MG CAPS capsule Take 0.8 mg by mouth daily.    Marland Kitchen. topiramate (TOPAMAX) 25 MG tablet Take one tablet at night for one week, then take 2 tablets at night 60 tablet 3   No current facility-administered medications for this visit.      Past Surgical History:  Procedure Laterality Date  . APPENDECTOMY  APH, 60's  . COLONOSCOPY N/A 12/05/2012   Procedure:  COLONOSCOPY;  Surgeon: Corbin Adeobert M Rourk, MD;  Location: [facility];  Service: Endoscopy;  Laterality: N/A;  1:00 PM     No Known Allergies    Family History  Problem Relation Age of Onset  . Cancer Mother   . Cancer Father   . Cancer Sister   . Cancer Brother      Social History [name] reports that he has quit smoking. He has never used smokeless tobacco. [name] reports that he does not drink alcohol.   Review of Systems CONSTITUTIONAL: No weight loss, fever, chills, weakness or fatigue.  HEENT: Eyes: No visual loss, blurred vision, double vision or yellow sclerae.No hearing loss, sneezing, congestion, runny nose or sore throat.  SKIN: No rash or itching.  CARDIOVASCULAR: per hpi RESPIRATORY: No shortness of breath, cough or sputum.  GASTROINTESTINAL: No anorexia, nausea, vomiting or diarrhea. No abdominal pain or blood.  GENITOURINARY: No burning on urination, no polyuria NEUROLOGICAL: No headache, dizziness, syncope, paralysis, ataxia, numbness or tingling in the extremities. No change in bowel or bladder control.  MUSCULOSKELETAL: No muscle, back pain, joint pain or stiffness.  LYMPHATICS: No enlarged nodes. No history of splenectomy.  PSYCHIATRIC: No history of depression or anxiety.  ENDOCRINOLOGIC: No reports of sweating, cold or heat intolerance. No polyuria or polydipsia.  .Marland Kitchen  Physical Examination Vitals:   05/24/18 0847 05/24/18 0852  BP: (!) 142/88 (!) 144/90  Pulse: (!) 55   SpO2: 99%    Vitals:   05/24/18 0847  Weight: 163 lb (73.9 kg)  Height: 5\' 10"  (1.778 m)    Gen: resting comfortably, no acute distress HEENT: no scleral icterus, pupils equal round and reactive, no palptable cervical adenopathy,  CV: RRR, no m/r/g, no jvd Resp: Clear to auscultation bilaterally GI: abdomen is soft, non-tender, non-distended, normal bowel sounds, no hepatosplenomegaly MSK: extremities are warm, no edema.  Skin: warm, no rash Neuro:  no focal  deficits Psych: appropriate affect    Assessment and Plan  1. Chest pain - somewhat atypical symptoms. His recent CT PE did show incidental finding of left main and 3 vessel CAD. - needs a functional assessment of his coronary disease, we will plan for an exericse nuclear stress test. Hold bystolic day of test  2. AAA screen - male over 43 with tobacco history, will order screening AAA [place].       Antoine Poche, M.D.

## 2018-05-24 NOTE — Patient Instructions (Addendum)
Medication Instructions:   Your physician recommends that you continue on your current medications as directed. Please refer to the Current Medication list given to you today.  Labwork:  NONE  Testing/Procedures: Your physician has requested that you have en exercise stress myoview. For further information please visit [email or web address]. Please follow instruction sheet, as given.  Your physician has requested that you have an abdominal aorta duplex. During this test, an ultrasound is used to evaluate the aorta. Allow 30 minutes for this exam. Do not eat after midnight the day before and avoid carbonated beverages  Follow-Up:  Your physician recommends that you schedule a follow-up appointment in: pending test results.  Any Other Special Instructions Will Be Listed Below (If Applicable).  If you need a refill on your cardiac medications before your next appointment, please call your pharmacy.

## 2018-06-06 ENCOUNTER — Ambulatory Visit (HOSPITAL_COMMUNITY): Admission: RE | Admit: 2018-06-06 | Payer: Medicare Other | Source: Ambulatory Visit

## 2018-06-06 ENCOUNTER — Encounter (HOSPITAL_COMMUNITY): Payer: Medicare Other

## 2018-06-26 DIAGNOSIS — E049 Nontoxic goiter, unspecified: Secondary | ICD-10-CM | POA: Diagnosis not present

## 2018-06-27 ENCOUNTER — Telehealth: Payer: Self-pay

## 2018-07-03 ENCOUNTER — Encounter (HOSPITAL_COMMUNITY)
Admission: RE | Admit: 2018-07-03 | Discharge: 2018-07-03 | Disposition: A | Discharge status: Home / Self Care | Payer: Medicare Other | Source: Ambulatory Visit | Attending: Cardiology | Admitting: Cardiology

## 2018-07-03 ENCOUNTER — Ambulatory Visit (HOSPITAL_COMMUNITY)
Admission: RE | Admit: 2018-07-03 | Discharge: 2018-07-03 | Disposition: A | Discharge status: Home / Self Care | Payer: Medicare Other | Source: Ambulatory Visit | Attending: Cardiology | Admitting: Cardiology

## 2018-07-03 DIAGNOSIS — R079 Chest pain, unspecified: Secondary | ICD-10-CM | POA: Diagnosis not present

## 2018-07-03 LAB — NM MYOCAR MULTI W/SPECT W/WALL MOTION / EF
CHL CUP NUCLEAR SDS: 1
CHL CUP NUCLEAR SRS: 3
CHL CUP NUCLEAR SSS: 4
CHL CUP RESTING HR STRESS: 53 {beats}/min
CHL RATE OF PERCEIVED EXERTION: 15
CSEPED: 7 min
CSEPEDS: 22 s
CSEPEW: 10.1 METS
CSEPPHR: 111 {beats}/min
LV dias vol: 80 mL (ref 62–150)
LV sys vol: 30 mL
MPHR: 147 {beats}/min
Percent HR: 75 %
RATE: 0.31
TID: 0.81

## 2018-07-03 MED ORDER — TECHNETIUM TC 99M TETROFOSMIN IV KIT
10.0000 | PACK | Freq: Once | INTRAVENOUS | Status: AC | PRN
  Administered 2018-07-03: 10.23 via INTRAVENOUS

## 2018-07-03 MED ORDER — SODIUM CHLORIDE 0.9% FLUSH
INTRAVENOUS | Status: AC
  Administered 2018-07-03: 10 mL via INTRAVENOUS
  Filled 2018-07-03: qty 10

## 2018-07-03 MED ORDER — REGADENOSON 0.4 MG/5ML IV SOLN
INTRAVENOUS | Status: AC
  Administered 2018-07-03: 0.4 mg via INTRAVENOUS
  Filled 2018-07-03: qty 5

## 2018-07-03 MED ORDER — TECHNETIUM TC 99M TETROFOSMIN IV KIT
30.0000 | PACK | Freq: Once | INTRAVENOUS | Status: AC | PRN
  Administered 2018-07-03: 27.2 via INTRAVENOUS

## 2018-07-14 ENCOUNTER — Other Ambulatory Visit: Payer: Self-pay

## 2018-07-14 ENCOUNTER — Encounter (HOSPITAL_COMMUNITY): Payer: Self-pay | Admitting: *Deleted

## 2018-07-14 NOTE — Progress Notes (Addendum)
Hector Welch denies chest pain or shortness of breath. Patient has stopped Aspirin for procedure. PCP is Dr Juanetta Gosling.

## 2018-07-14 NOTE — Progress Notes (Signed)
Anesthesia Chart Review:  Case:  [id number] Date/Time:  07/17/18 0900   Procedure:  TOTAL THYROIDECTOMY (N/A )   Anesthesia type:  General   Pre-op diagnosis:  substernal thyroid goiter, multiple thyroid nodules   Location:  [facility] OR ROOM 08 / [facility] OR   Surgeon:  Darnell Level, MD      DISCUSSION: 72 yo male former smoker. Pertinent hx includes HTN, GERD, TIA (12/2017).  Pt had what Dr. Clarisa Kindred notes refer to as a "TIA type event". He last saw Dr. Anne Hahn 02/27/2018 and at that time was recommended to remain on ASA and f/u in 6 months.  Pt also followed by Dr. Wyline Mood post ED visit for CP 03/2018.  From ER notes was worst with deep breathing and position. Was having some positional neck pain at the time. Trops neg x2, EKG SR nonspecific inferior ST/T changes, CT PE negative. Some evidence of bronchiolitis, was given azithromycin and short steroid course. CT did show left main and 3 vessel CAD. Of note a Jan 2019 echo showed LVEF 55-60%, no WMAs  Dr. Wyline Mood recommended nuclear stress test which was performed 07/03/2018 and showed EF 63%, medium sized, moderate intensity, fixed inferior defect that is more prominent at rest and consistent with diaphragmatic attenuation than scar. No definite ischemic defects. Equivocal ST segment changes in absence of chest pain. Low risk study.  Per surgery posting he is to stop ASA 5d preop.  Anticipate he can proceed as planned barring acute status change and DOS labs acceptable.   VS: There were no vitals taken for this visit.  PROVIDERSKari Baars, MD is PCP  Dina Rich, MD is Cardiologist last seen 05/24/2018  Stephanie Acre, MD is Neurologist last seen 02/27/2018  LABS: Will need same day labs  Labs Reviewed - No data to display   IMAGES: CT Angio chest 04/01/2018: IMPRESSION: 1. Redemonstration of large substernal goiter deviating the trachea and esophagus to the right. 2. Bibasilar subpleural atelectasis and scarring, right greater  than left with superimposed tree-in-bud opacities compatible with bronchiolitis. 3. No acute pulmonary embolus. 4. Nonaneurysmal thoracic aorta. 5. Left main and three-vessel coronary arteriosclerosis.  EKG: 04/01/2018: Sinus rhythm with short PR (94ms)  CV: Lexiscan Myoview 07/03/2018:  Exercise/Lexiscan Myoview (achieved only 75% MPHR). Equivocal ST segment changes were noted in the absence of chest pain.  Blood pressure demonstrated a hypertensive response to exercise.  Medium sized, moderate intensity, fixed inferior defect that is more prominent at rest and consistent with diaphragmatic attenuation rather than scar. No definite ischemic defects.  This is a low risk study.  Nuclear stress EF: 63%.  Cardiac event monitor 12/08/2017: Sinus rhythm Rare premature ventricular contractions No sustained arrhythmias No atrial fibrillation    TTE 11/16/2017: Study Conclusions  - Left ventricle: The cavity size was normal. Wall thickness was at   the upper limits of normal. Systolic function was normal. The   estimated ejection fraction was in the range of 55% to 60%. Wall   motion was normal; there were no regional wall motion   abnormalities. Left ventricular diastolic function parameters   were normal. - Aortic valve: Mildly calcified annulus. Trileaflet; mildly   calcified leaflets. - Mitral valve: There was mild regurgitation. - Left atrium: The atrium was mildly to moderately dilated. - Right atrium: Central venous pressure (est): 3 mm Hg. - Atrial septum: No defect or patent foramen ovale was identified. - Tricuspid valve: There was mild regurgitation. - Pulmonary arteries: PA peak pressure: 26 mm Hg (S). -  Pericardium, extracardiac: There was no pericardial effusion.  Impressions:  - Upper normal LV wall thickness with LVEF 55-60% and normal   diastolic function. Mild to moderate left atrial enlargement.   Mild mitral regurgitation. Mildly sclerotic aortic  valve. Mild   tricuspid regurgitation with estimated PASP 26 mmHg. Past Medical History:  Diagnosis Date  . Astigmatism   . Cervicalgia   . GERD (gastroesophageal reflux disease)   . Glaucoma   . Hypertension   . Mini stroke (HCC) 12/2017  . Osteoarthritis     Past Surgical History:  Procedure Laterality Date  . APPENDECTOMY  APH, 60's  . COLONOSCOPY N/A 12/05/2012   Procedure: COLONOSCOPY;  Surgeon: Corbin Ade, MD;  Location: [facility];  Service: Endoscopy;  Laterality: N/A;  1:00 PM    MEDICATIONS: No current facility-administered medications for this encounter.    Marland Kitchen atorvastatin (LIPITOR) 20 MG tablet  . nebivolol (BYSTOLIC) 10 MG tablet  . aspirin EC 81 MG tablet    Zannie Cove [facility] Stay [facility]/Anesthesiology Phone [phone] 07/14/2018 9:51 AM

## 2018-07-16 ENCOUNTER — Encounter (HOSPITAL_COMMUNITY): Payer: Self-pay | Admitting: Surgery

## 2018-07-16 ENCOUNTER — Ambulatory Visit: Payer: Self-pay | Admitting: Surgery

## 2018-07-16 DIAGNOSIS — E049 Nontoxic goiter, unspecified: Secondary | ICD-10-CM | POA: Diagnosis present

## 2018-07-16 NOTE — H&P (Signed)
General Surgery [facility]  Hector Welch DOB: August 26, 1946 Married / Language: English / Race: Black or African American Male   History of Present Illness  The patient is a 72 year old male who presents with a complaint of Enlarged thyroid.  CHIEF COMPLAINT: substernal thyroid goiter  Patient returns to discuss results of fine-needle aspiration biopsies and to discuss further management of substernal thyroid goiter. Patient has bilateral thyroid nodules. Suspicious nodules on the left side underwent fine-needle aspiration biopsy. 2 of these nodules showed benign findings. However one of these nodules was insufficient for evaluation. Patient has an enlarged left thyroid lobe measuring up to 13 cm and extending into the mediastinum. There is tracheal and esophageal deviation to the right seen on his CT angiogram study from June 2019. There are calcifications in the left thyroid lobe. I have discussed these findings with Dr. Shaune Pollack. Patient and his wife returned today to discuss the results and to discuss further management.   Allergies No Known Drug Allergies [05/03/2018]: Allergies Reconciled   Medication History Atorvastatin Calcium (20MG  Tablet, Oral) Active. Bystolic (10MG  Tablet, Oral) Active. Aspirin (81MG  Tablet, Oral) Active. Medications Reconciled  Vitals  Weight: 164.6 lb Height: 70in Body Surface Area: 1.92 m Body Mass Index: 23.62 kg/m  Temp.: 98.59F  Pulse: 68 (Regular)  BP: 128/88 (Sitting, Left Arm, Standard)  Physical Exam  See vital signs recorded above  GENERAL APPEARANCE Development: normal Nutritional status: normal Gross deformities: none  SKIN Rash, lesions, ulcers: none Induration, erythema: none Nodules: none palpable  EYES Conjunctiva and lids: normal Pupils: equal and reactive Iris: normal bilaterally  EARS, NOSE, MOUTH, THROAT External ears: no lesion or deformity External nose: no  lesion or deformity Hearing: grossly normal Lips: no lesion or deformity Dentition: normal for age Oral mucosa: moist  NECK Symmetric: yes Trachea: midline Thyroid: Right thyroid lobe is normal to palpation without discrete or dominant mass. The left thyroid lobe is palpable. It is relatively firm. It extends approximately 4 cm above the clavicle. However it extends beneath the clavicle and on physical examination the remainder of the left lobe is not assessable.  CHEST Respiratory effort: normal Retraction or accessory muscle use: no Breath sounds: normal bilaterally Rales, rhonchi, wheeze: none  CARDIOVASCULAR Auscultation: regular rhythm, normal rate Murmurs: none Pulses: carotid and radial pulse 2+ palpable Lower extremity edema: none Lower extremity varicosities: none  MUSCULOSKELETAL Station and gait: normal Digits and nails: no clubbing or cyanosis Muscle strength: grossly normal all extremities Range of motion: grossly normal all extremities Deformity: none  LYMPHATIC Cervical: none palpable Supraclavicular: none palpable  PSYCHIATRIC Oriented to person, place, and time: yes Mood and affect: normal for situation Judgment and insight: appropriate for situation    Assessment & Plan  ENLARGED THYROID (E04.9) SUBSTERNAL THYROID GOITER (E04.9)  Patient returns today accompanied by his wife to discuss results of testing and to review his diagnostic studies. Patient has an enlarged left thyroid lobe which represents a substernal goiter. Biopsies were benign but one of the biopsies was inconclusive. On CT scan he does have calcifications involving the left thyroid lobe in the mediastinum. Patient denies any compressive symptoms, with no complaints of dysphagia or dyspnea. However, on CT scan, there is significant deviation of both the trachea and the esophagus towards the right side.  I have recommended proceeding with total thyroidectomy for management of  multiple thyroid nodules and substernal thyroid goiter. We have discussed risk and benefits of this procedure including the potential  for alteration in voice quality. We have discussed the hospital stay to be anticipated.  Patient will need to complete cardiac assessment prior to scheduling surgery. We will send a request for cardiac clearance to the patient's cardiologist today.  Once we have cardiac clearance, we will proceed with scheduling the patient for surgery at [facility].  The risks and benefits of the procedure have been discussed at length with the patient. The patient understands the proposed procedure, potential alternative treatments, and the course of recovery to be expected. All of the patient's questions have been answered at this time. The patient wishes to proceed with surgery.  Darnell Level, MD [facility] Office: [phone]

## 2018-07-17 ENCOUNTER — Ambulatory Visit (HOSPITAL_COMMUNITY): Payer: Medicare Other | Admitting: Physician Assistant

## 2018-07-17 ENCOUNTER — Encounter (HOSPITAL_COMMUNITY): Payer: Self-pay

## 2018-07-17 ENCOUNTER — Encounter (HOSPITAL_COMMUNITY)
Admission: RE | Disposition: A | Discharge status: Home / Self Care | Payer: Self-pay | Source: Ambulatory Visit | Attending: Surgery

## 2018-07-17 ENCOUNTER — Ambulatory Visit (HOSPITAL_COMMUNITY): Payer: Medicare Other

## 2018-07-17 ENCOUNTER — Observation Stay (HOSPITAL_COMMUNITY)
Admission: RE | Admit: 2018-07-17 | Discharge: 2018-07-18 | Disposition: A | Discharge status: Home / Self Care | Payer: Medicare Other | Source: Ambulatory Visit | Attending: Surgery | Admitting: Surgery

## 2018-07-17 DIAGNOSIS — I10 Essential (primary) hypertension: Secondary | ICD-10-CM | POA: Diagnosis not present

## 2018-07-17 DIAGNOSIS — Z79899 Other long term (current) drug therapy: Secondary | ICD-10-CM | POA: Insufficient documentation

## 2018-07-17 DIAGNOSIS — H52209 Unspecified astigmatism, unspecified eye: Secondary | ICD-10-CM | POA: Insufficient documentation

## 2018-07-17 DIAGNOSIS — Z87891 Personal history of nicotine dependence: Secondary | ICD-10-CM | POA: Insufficient documentation

## 2018-07-17 DIAGNOSIS — I081 Rheumatic disorders of both mitral and tricuspid valves: Secondary | ICD-10-CM | POA: Diagnosis not present

## 2018-07-17 DIAGNOSIS — Z8673 Personal history of transient ischemic attack (TIA), and cerebral infarction without residual deficits: Secondary | ICD-10-CM | POA: Diagnosis not present

## 2018-07-17 DIAGNOSIS — Z7982 Long term (current) use of aspirin: Secondary | ICD-10-CM | POA: Insufficient documentation

## 2018-07-17 DIAGNOSIS — E049 Nontoxic goiter, unspecified: Secondary | ICD-10-CM | POA: Diagnosis not present

## 2018-07-17 DIAGNOSIS — I493 Ventricular premature depolarization: Secondary | ICD-10-CM | POA: Insufficient documentation

## 2018-07-17 DIAGNOSIS — I2510 Atherosclerotic heart disease of native coronary artery without angina pectoris: Secondary | ICD-10-CM | POA: Diagnosis not present

## 2018-07-17 DIAGNOSIS — K219 Gastro-esophageal reflux disease without esophagitis: Secondary | ICD-10-CM | POA: Diagnosis not present

## 2018-07-17 DIAGNOSIS — E042 Nontoxic multinodular goiter: Secondary | ICD-10-CM | POA: Diagnosis not present

## 2018-07-17 DIAGNOSIS — Z01818 Encounter for other preprocedural examination: Secondary | ICD-10-CM

## 2018-07-17 DIAGNOSIS — H409 Unspecified glaucoma: Secondary | ICD-10-CM | POA: Diagnosis not present

## 2018-07-17 DIAGNOSIS — M1990 Unspecified osteoarthritis, unspecified site: Secondary | ICD-10-CM | POA: Insufficient documentation

## 2018-07-17 HISTORY — DX: Nontoxic goiter, unspecified: E04.9

## 2018-07-17 HISTORY — DX: Unspecified cataract: H26.9

## 2018-07-17 HISTORY — PX: THYROIDECTOMY: SHX17

## 2018-07-17 LAB — BASIC METABOLIC PANEL
Anion gap: 6 (ref 5–15)
BUN: 11 mg/dL (ref 8–23)
CALCIUM: 9.2 mg/dL (ref 8.9–10.3)
CO2: 25 mmol/L (ref 22–32)
CREATININE: 1.13 mg/dL (ref 0.61–1.24)
Chloride: 107 mmol/L (ref 98–111)
GFR calc Af Amer: >60 mL/min (ref >60)
GFR calc non Af Amer: >60 mL/min (ref >60)
GLUCOSE: 99 mg/dL (ref 70–99)
Potassium: 4.1 mmol/L (ref 3.5–5.1)
Sodium: 138 mmol/L (ref 135–145)

## 2018-07-17 LAB — CBC
HEMATOCRIT: 40.1 % (ref 39.0–52.0)
Hemoglobin: 12.9 g/dL — ABNORMAL LOW (ref 13.0–17.0)
MCH: 32 pg (ref 26.0–34.0)
MCHC: 32.2 g/dL (ref 30.0–36.0)
MCV: 99.5 fL (ref 78.0–100.0)
Platelets: 152 10*3/uL (ref 150–400)
RBC: 4.03 MIL/uL — ABNORMAL LOW (ref 4.22–5.81)
RDW: 12.1 % (ref 11.5–15.5)
WBC: 4.6 10*3/uL (ref 4.0–10.5)

## 2018-07-17 SURGERY — THYROIDECTOMY
Anesthesia: General | Site: Neck

## 2018-07-17 MED ORDER — FENTANYL CITRATE (PF) 250 MCG/5ML IJ SOLN
INTRAMUSCULAR | Status: AC
  Filled 2018-07-17: qty 5

## 2018-07-17 MED ORDER — PROPOFOL 10 MG/ML IV BOLUS
INTRAVENOUS | Status: AC
  Filled 2018-07-17: qty 20

## 2018-07-17 MED ORDER — KETOROLAC TROMETHAMINE 15 MG/ML IJ SOLN
INTRAMUSCULAR | Status: AC
  Filled 2018-07-17: qty 1

## 2018-07-17 MED ORDER — HYDROMORPHONE HCL 1 MG/ML IJ SOLN: 1.0000 mg | INTRAMUSCULAR | Status: DC | PRN

## 2018-07-17 MED ORDER — HYDRALAZINE HCL 20 MG/ML IJ SOLN
10.0000 mg | Freq: Once | INTRAMUSCULAR | Status: AC
  Administered 2018-07-17: 10 mg via INTRAVENOUS

## 2018-07-17 MED ORDER — KETOROLAC TROMETHAMINE 15 MG/ML IJ SOLN
15.0000 mg | Freq: Once | INTRAMUSCULAR | Status: AC
  Administered 2018-07-17: 15 mg via INTRAVENOUS

## 2018-07-17 MED ORDER — OXYCODONE HCL 5 MG/5ML PO SOLN: 5.0000 mg | Freq: Once | ORAL | Status: DC | PRN

## 2018-07-17 MED ORDER — OXYCODONE HCL 5 MG PO TABS: 5.0000 mg | ORAL_TABLET | Freq: Once | ORAL | Status: DC | PRN

## 2018-07-17 MED ORDER — LABETALOL HCL 5 MG/ML IV SOLN
5.0000 mg | INTRAVENOUS | Status: DC | PRN
  Administered 2018-07-17: 5 mg via INTRAVENOUS

## 2018-07-17 MED ORDER — DEXAMETHASONE SODIUM PHOSPHATE 4 MG/ML IJ SOLN
INTRAMUSCULAR | Status: DC | PRN
  Administered 2018-07-17: 10 mg via INTRAVENOUS

## 2018-07-17 MED ORDER — ROCURONIUM BROMIDE 100 MG/10ML IV SOLN
INTRAVENOUS | Status: DC | PRN
  Administered 2018-07-17: 40 mg via INTRAVENOUS
  Administered 2018-07-17: 10 mg via INTRAVENOUS

## 2018-07-17 MED ORDER — CALCIUM CARBONATE 1250 (500 CA) MG PO TABS
2.0000 | ORAL_TABLET | Freq: Three times a day (TID) | ORAL | Status: DC
  Administered 2018-07-17 – 2018-07-18 (×3): 1000 mg via ORAL
  Filled 2018-07-17 (×6): qty 1

## 2018-07-17 MED ORDER — MIDAZOLAM HCL 5 MG/5ML IJ SOLN
INTRAMUSCULAR | Status: DC | PRN
  Administered 2018-07-17: 2 mg via INTRAVENOUS

## 2018-07-17 MED ORDER — CHLORHEXIDINE GLUCONATE CLOTH 2 % EX PADS: 6.0000 | MEDICATED_PAD | Freq: Once | CUTANEOUS | Status: DC

## 2018-07-17 MED ORDER — LACTATED RINGERS IV SOLN
INTRAVENOUS | Status: DC
  Administered 2018-07-17: 08:00:00 via INTRAVENOUS

## 2018-07-17 MED ORDER — PHENOL 1.4 % MT LIQD
1.0000 | OROMUCOSAL | Status: DC | PRN
  Administered 2018-07-17: 1 via OROMUCOSAL
  Filled 2018-07-17: qty 177

## 2018-07-17 MED ORDER — MEPERIDINE HCL 50 MG/ML IJ SOLN: 6.2500 mg | INTRAMUSCULAR | Status: DC | PRN

## 2018-07-17 MED ORDER — PHENYLEPHRINE 40 MCG/ML (10ML) SYRINGE FOR IV PUSH (FOR BLOOD PRESSURE SUPPORT)
PREFILLED_SYRINGE | INTRAVENOUS | Status: AC
  Filled 2018-07-17: qty 10

## 2018-07-17 MED ORDER — FENTANYL CITRATE (PF) 100 MCG/2ML IJ SOLN
INTRAMUSCULAR | Status: DC | PRN
  Administered 2018-07-17 (×5): 50 ug via INTRAVENOUS

## 2018-07-17 MED ORDER — 0.9 % SODIUM CHLORIDE (POUR BTL) OPTIME
TOPICAL | Status: DC | PRN
  Administered 2018-07-17: 1000 mL

## 2018-07-17 MED ORDER — DEXAMETHASONE SODIUM PHOSPHATE 10 MG/ML IJ SOLN
INTRAMUSCULAR | Status: AC
  Filled 2018-07-17: qty 1

## 2018-07-17 MED ORDER — TRAMADOL HCL 50 MG PO TABS: 50.0000 mg | ORAL_TABLET | Freq: Four times a day (QID) | ORAL | Status: DC | PRN

## 2018-07-17 MED ORDER — LIDOCAINE 2% (20 MG/ML) 5 ML SYRINGE
INTRAMUSCULAR | Status: AC
  Filled 2018-07-17: qty 5

## 2018-07-17 MED ORDER — HYDROCODONE-ACETAMINOPHEN 5-325 MG PO TABS
1.0000 | ORAL_TABLET | ORAL | Status: DC | PRN
  Administered 2018-07-18: 2 via ORAL
  Administered 2018-07-18: 1 via ORAL
  Filled 2018-07-17: qty 2
  Filled 2018-07-17: qty 1

## 2018-07-17 MED ORDER — LABETALOL HCL 5 MG/ML IV SOLN
INTRAVENOUS | Status: AC
  Filled 2018-07-17: qty 4

## 2018-07-17 MED ORDER — ACETAMINOPHEN 10 MG/ML IV SOLN
1000.0000 mg | Freq: Four times a day (QID) | INTRAVENOUS | Status: DC
  Administered 2018-07-17: 1000 mg via INTRAVENOUS

## 2018-07-17 MED ORDER — HYDROMORPHONE HCL 1 MG/ML IJ SOLN
INTRAMUSCULAR | Status: AC
  Filled 2018-07-17: qty 1

## 2018-07-17 MED ORDER — ONDANSETRON HCL 4 MG/2ML IJ SOLN
INTRAMUSCULAR | Status: AC
  Filled 2018-07-17: qty 2

## 2018-07-17 MED ORDER — LACTATED RINGERS IV SOLN
INTRAVENOUS | Status: DC | PRN
  Administered 2018-07-17 (×2): via INTRAVENOUS

## 2018-07-17 MED ORDER — NEBIVOLOL HCL 10 MG PO TABS
10.0000 mg | ORAL_TABLET | Freq: Every day | ORAL | Status: DC
  Administered 2018-07-17: 10 mg via ORAL
  Filled 2018-07-17: qty 1

## 2018-07-17 MED ORDER — LIDOCAINE 2% (20 MG/ML) 5 ML SYRINGE
INTRAMUSCULAR | Status: DC | PRN
  Administered 2018-07-17: 100 mg via INTRAVENOUS

## 2018-07-17 MED ORDER — ONDANSETRON HCL 4 MG/2ML IJ SOLN: 4.0000 mg | Freq: Four times a day (QID) | INTRAMUSCULAR | Status: DC | PRN

## 2018-07-17 MED ORDER — KCL IN DEXTROSE-NACL 20-5-0.45 MEQ/L-%-% IV SOLN
INTRAVENOUS | Status: DC
  Administered 2018-07-17: 15:00:00 via INTRAVENOUS
  Filled 2018-07-17 (×2): qty 1000

## 2018-07-17 MED ORDER — MIDAZOLAM HCL 2 MG/2ML IJ SOLN
INTRAMUSCULAR | Status: AC
  Filled 2018-07-17: qty 2

## 2018-07-17 MED ORDER — SUGAMMADEX SODIUM 200 MG/2ML IV SOLN
INTRAVENOUS | Status: DC | PRN
  Administered 2018-07-17: 200 mg via INTRAVENOUS

## 2018-07-17 MED ORDER — CEFAZOLIN SODIUM-DEXTROSE 2-4 GM/100ML-% IV SOLN
2.0000 g | INTRAVENOUS | Status: AC
  Administered 2018-07-17: 2 g via INTRAVENOUS
  Filled 2018-07-17: qty 100

## 2018-07-17 MED ORDER — PROMETHAZINE HCL 25 MG/ML IJ SOLN: 6.2500 mg | INTRAMUSCULAR | Status: DC | PRN

## 2018-07-17 MED ORDER — HYDRALAZINE HCL 20 MG/ML IJ SOLN
INTRAMUSCULAR | Status: AC
  Filled 2018-07-17: qty 1

## 2018-07-17 MED ORDER — ACETAMINOPHEN 325 MG PO TABS: 650.0000 mg | ORAL_TABLET | Freq: Four times a day (QID) | ORAL | Status: DC | PRN

## 2018-07-17 MED ORDER — ACETAMINOPHEN 10 MG/ML IV SOLN
INTRAVENOUS | Status: AC
  Filled 2018-07-17: qty 100

## 2018-07-17 MED ORDER — HEMOSTATIC AGENTS (NO CHARGE) OPTIME
TOPICAL | Status: DC | PRN
  Administered 2018-07-17: 1 via TOPICAL

## 2018-07-17 MED ORDER — ONDANSETRON HCL 4 MG/2ML IJ SOLN
INTRAMUSCULAR | Status: DC | PRN
  Administered 2018-07-17: 4 mg via INTRAVENOUS

## 2018-07-17 MED ORDER — ACETAMINOPHEN 650 MG RE SUPP: 650.0000 mg | Freq: Four times a day (QID) | RECTAL | Status: DC | PRN

## 2018-07-17 MED ORDER — ROCURONIUM BROMIDE 50 MG/5ML IV SOSY
PREFILLED_SYRINGE | INTRAVENOUS | Status: AC
  Filled 2018-07-17: qty 5

## 2018-07-17 MED ORDER — ONDANSETRON 4 MG PO TBDP: 4.0000 mg | ORAL_TABLET | Freq: Four times a day (QID) | ORAL | Status: DC | PRN

## 2018-07-17 MED ORDER — PHENYLEPHRINE 40 MCG/ML (10ML) SYRINGE FOR IV PUSH (FOR BLOOD PRESSURE SUPPORT)
PREFILLED_SYRINGE | INTRAVENOUS | Status: DC | PRN
  Administered 2018-07-17 (×2): 40 ug via INTRAVENOUS
  Administered 2018-07-17 (×2): 80 ug via INTRAVENOUS

## 2018-07-17 MED ORDER — LABETALOL HCL 5 MG/ML IV SOLN
INTRAVENOUS | Status: DC | PRN
  Administered 2018-07-17: 5 mg via INTRAVENOUS

## 2018-07-17 MED ORDER — SODIUM CHLORIDE 0.9 % IV SOLN
INTRAVENOUS | Status: DC | PRN
  Administered 2018-07-17: 60 ug/min via INTRAVENOUS

## 2018-07-17 MED ORDER — HYDROMORPHONE HCL 1 MG/ML IJ SOLN
0.2500 mg | INTRAMUSCULAR | Status: DC | PRN
  Administered 2018-07-17 (×2): 0.5 mg via INTRAVENOUS

## 2018-07-17 MED ORDER — PROPOFOL 10 MG/ML IV BOLUS
INTRAVENOUS | Status: DC | PRN
  Administered 2018-07-17: 50 mg via INTRAVENOUS
  Administered 2018-07-17: 150 mg via INTRAVENOUS

## 2018-07-17 SURGICAL SUPPLY — 51 items
ATTRACTOMAT 16X20 MAGNETIC DRP (DRAPES) ×2 IMPLANT
BLADE CLIPPER SURG (BLADE) IMPLANT
BLADE SURG 10 STRL SS (BLADE) ×2 IMPLANT
BLADE SURG 15 STRL LF DISP TIS (BLADE) ×1 IMPLANT
BLADE SURG 15 STRL SS (BLADE) ×2
CANISTER SUCT 3000ML PPV (MISCELLANEOUS) ×2 IMPLANT
CHLORAPREP W/TINT 10.5 ML (MISCELLANEOUS) ×2 IMPLANT
CLIP VESOCCLUDE MED 24/CT (CLIP) ×2 IMPLANT
CLIP VESOCCLUDE SM WIDE 24/CT (CLIP) ×2 IMPLANT
COVER SURGICAL LIGHT HANDLE (MISCELLANEOUS) ×2 IMPLANT
CRADLE DONUT ADULT HEAD (MISCELLANEOUS) ×2 IMPLANT
DRAPE LAPAROTOMY 100X72 PEDS (DRAPES) ×2 IMPLANT
DRAPE UTILITY XL STRL (DRAPES) ×2 IMPLANT
ELECT CAUTERY BLADE 6.4 (BLADE) ×2 IMPLANT
ELECT REM PT RETURN 9FT ADLT (ELECTROSURGICAL) ×2
ELECTRODE REM PT RTRN 9FT ADLT (ELECTROSURGICAL) ×1 IMPLANT
GAUZE 4X4 16PLY RFD (DISPOSABLE) ×3 IMPLANT
GAUZE SPONGE 4X4 12PLY STRL (GAUZE/BANDAGES/DRESSINGS) ×2 IMPLANT
GLOVE BIO SURGEON STRL SZ7 (GLOVE) ×1 IMPLANT
GLOVE BIO SURGEON STRL SZ7.5 (GLOVE) ×1 IMPLANT
GLOVE BIOGEL PI IND STRL 7.0 (GLOVE) IMPLANT
GLOVE BIOGEL PI IND STRL 7.5 (GLOVE) IMPLANT
GLOVE BIOGEL PI INDICATOR 7.0 (GLOVE) ×1
GLOVE BIOGEL PI INDICATOR 7.5 (GLOVE) ×1
GLOVE SURG ORTHO 8.0 STRL STRW (GLOVE) ×3 IMPLANT
GOWN STRL REUS W/ TWL LRG LVL3 (GOWN DISPOSABLE) ×1 IMPLANT
GOWN STRL REUS W/ TWL XL LVL3 (GOWN DISPOSABLE) ×1 IMPLANT
GOWN STRL REUS W/TWL LRG LVL3 (GOWN DISPOSABLE) ×4
GOWN STRL REUS W/TWL XL LVL3 (GOWN DISPOSABLE) ×2
HEMOSTAT ARISTA ABSORB 3G PWDR (MISCELLANEOUS) IMPLANT
HEMOSTAT SURGICEL 2X4 FIBR (HEMOSTASIS) ×2 IMPLANT
ILLUMINATOR WAVEGUIDE N/F (MISCELLANEOUS) ×1 IMPLANT
KIT BASIN OR (CUSTOM PROCEDURE TRAY) ×2 IMPLANT
KIT TURNOVER KIT B (KITS) ×2 IMPLANT
LIGHT WAVEGUIDE WIDE FLAT (MISCELLANEOUS) IMPLANT
NS IRRIG 1000ML POUR BTL (IV SOLUTION) ×2 IMPLANT
PACK SURGICAL SETUP 50X90 (CUSTOM PROCEDURE TRAY) ×2 IMPLANT
PAD ARMBOARD 7.5X6 YLW CONV (MISCELLANEOUS) ×2 IMPLANT
PENCIL BUTTON HOLSTER BLD 10FT (ELECTRODE) ×2 IMPLANT
SHEARS HARMONIC 9CM CVD (BLADE) ×2 IMPLANT
SPECIMEN JAR MEDIUM (MISCELLANEOUS) ×2 IMPLANT
SPONGE INTESTINAL PEANUT (DISPOSABLE) ×2 IMPLANT
STRIP CLOSURE SKIN 1/2X4 (GAUZE/BANDAGES/DRESSINGS) ×2 IMPLANT
SUT MNCRL AB 4-0 PS2 18 (SUTURE) ×2 IMPLANT
SUT SILK 2 0 (SUTURE) ×2
SUT SILK 2-0 18XBRD TIE 12 (SUTURE) ×1 IMPLANT
SUT VIC AB 3-0 SH 18 (SUTURE) ×4 IMPLANT
SYR BULB 3OZ (MISCELLANEOUS) ×2 IMPLANT
TOWEL OR 17X24 6PK STRL BLUE (TOWEL DISPOSABLE) ×2 IMPLANT
TOWEL OR 17X26 10 PK STRL BLUE (TOWEL DISPOSABLE) ×2 IMPLANT
TUBE CONNECTING 12X1/4 (SUCTIONS) ×2 IMPLANT

## 2018-07-17 NOTE — Interval H&P Note (Signed)
History and Physical Interval Note:  07/17/2018 8:51 AM  Hector Welch.  has presented today for surgery, with the diagnosis of substernal thyroid goiter, multiple thyroid nodules.  The various methods of treatment have been discussed with the patient and family. After consideration of risks, benefits and other options for treatment, the patient has consented to    Procedure(s): TOTAL THYROIDECTOMY (N/A) as a surgical intervention .    The patient's history has been reviewed, patient examined, no change in status, stable for surgery.  I have reviewed the patient's chart and labs.  Questions were answered to the patient's satisfaction.    Darnell Level, MD [facility] Surgery Office: [phone]    Jailin Manocchio Judie Petit

## 2018-07-17 NOTE — Progress Notes (Signed)
Pt came out with swelling under dressing. Pressure applied and Dr. Sid Falcon called to bedside.  Dr Sid Falcon removed dressing and swelling present as well as some crepitus.  Ice pack appied. Will continue to monitor.

## 2018-07-17 NOTE — Anesthesia Procedure Notes (Addendum)
Procedure Name: Intubation Date/Time: 07/17/2018 9:28 AM Performed by: Julian Reil, CRNA Pre-anesthesia Checklist: Patient identified, Emergency Drugs available, Suction available and Patient being monitored Patient Re-evaluated:Patient Re-evaluated prior to induction Oxygen Delivery Method: Circle system utilized Preoxygenation: Pre-oxygenation with 100% oxygen Induction Type: IV induction Ventilation: Mask ventilation without difficulty Laryngoscope Size: Miller and 3 Grade View: Grade I Tube type: Oral Tube size: 7.5 mm Number of attempts: 1 Airway Equipment and Method: Stylet Placement Confirmation: ETT inserted through vocal cords under direct vision,  positive ETCO2 and breath sounds checked- equal and bilateral Secured at: 23 cm Tube secured with: Tape Dental Injury: Teeth and Oropharynx as per pre-operative assessment  Comments: In Short-Stay noted many upper and lower teeth missing, none loose per patient report.

## 2018-07-17 NOTE — Anesthesia Preprocedure Evaluation (Addendum)
Anesthesia Evaluation  Patient identified by MRN, date of birth, ID band Patient awake    Reviewed: Allergy & Precautions, NPO status , Patient's Chart, lab work & pertinent test results  Airway Mallampati: II  TM Distance: >3 FB Neck ROM: Full    Dental no notable dental hx. (+) Dental Advisory Given, Missing   Pulmonary neg pulmonary ROS, former smoker,    Pulmonary exam normal breath sounds clear to auscultation       Cardiovascular hypertension, Pt. on medications negative cardio ROS Normal cardiovascular exam Rhythm:Regular Rate:Normal     Neuro/Psych negative neurological ROS  negative psych ROS   GI/Hepatic negative GI ROS, Neg liver ROS, GERD  ,  Endo/Other  negative endocrine ROS  Renal/GU negative Renal ROS  negative genitourinary   Musculoskeletal negative musculoskeletal ROS (+) Arthritis , Osteoarthritis,    Abdominal   Peds negative pediatric ROS (+)  Hematology negative hematology ROS (+)   Anesthesia Other Findings   Reproductive/Obstetrics negative OB ROS                            Anesthesia Physical Anesthesia Plan  ASA: II  Anesthesia Plan: General   Post-op Pain Management:    Induction: Intravenous  PONV Risk Score and Plan: 2 and Ondansetron and Midazolam  Airway Management Planned: Oral ETT  Additional Equipment:   Intra-op Plan:   Post-operative Plan: Extubation in OR  Informed Consent: I have reviewed the patients History and Physical, chart, labs and discussed the procedure including the risks, benefits and alternatives for the proposed anesthesia with the patient or authorized representative who has indicated his/her understanding and acceptance.   Dental advisory given  Plan Discussed with: CRNA  Anesthesia Plan Comments:         Anesthesia Quick Evaluation

## 2018-07-17 NOTE — Op Note (Signed)
Procedure Note  Pre-operative Diagnosis:  Substernal thyroid goiter  Post-operative Diagnosis:  same  Surgeon:  Darnell Level, MD  Assistant:  Magnus Ivan, RNFA   Procedure:  Total thyroidectomy and resection of posterior mediastinal mass (goiter)  Anesthesia:  General  Estimated Blood Loss:  50 cc  Drains: none         Specimen: thyroid and mass to pathology  Indications:  Patient returns to discuss results of fine-needle aspiration biopsies and to discuss further management of substernal thyroid goiter. Patient has bilateral thyroid nodules. Suspicious nodules on the left side underwent fine-needle aspiration biopsy. 2 of these nodules showed benign findings. However one of these nodules was insufficient for evaluation. Patient has an enlarged left thyroid lobe measuring up to 13 cm and extending into the mediastinum. There is tracheal and esophageal deviation to the right seen on his CT angiogram study from June 2019. There are calcifications in the left thyroid lobe. I have discussed these findings with Dr. Shaune Pollack. Patient and his wife returned today to discuss the results and to plan for surgery.  Procedure Details: Procedure was done in OR #8 at the [facility].  The patient was brought to the operating room and placed in a supine position on the operating room table.  Following administration of general anesthesia, the patient was positioned and then prepped and draped in the usual aseptic fashion.  After ascertaining that an adequate level of anesthesia had been achieved, a Kocher incision was made with #15 blade.  Dissection was carried through subcutaneous tissues and platysma. Hemostasis was achieved with the electrocautery.  Skin flaps were elevated cephalad and caudad from the thyroid notch to the sternal notch.  The Mahorner self-retaining retractor was placed for exposure.  Strap muscles were incised in the midline and dissection was begun on the left side.   Strap muscles were reflected laterally.  Left thyroid lobe was moderately enlarged, soft, and smooth.  The left lobe was gently mobilized with blunt dissection.  Superior pole vessels were dissected out and divided individually between small and medium Ligaclips with the Harmonic scalpel.  The thyroid lobe was rolled anteriorly.  Branches of the inferior thyroid artery were divided between small Ligaclips with the Harmonic scalpel.  Inferior venous tributaries were divided between Ligaclips.  Both the superior and inferior parathyroid glands were identified and preserved on their vascular pedicles.  The recurrent laryngeal nerve was identified and preserved along its course.  The ligament of Allyson Sabal was released with the electrocautery and the gland was mobilized onto the anterior trachea. Isthmus was mobilized across the midline.  There was a moderate sized pyramidal lobe present which was resected en bloc with the isthmus.  Dry pack was placed in the left neck.  Next, the right thyroid lobe was gently mobilized with blunt dissection.  Right thyroid lobe was normal in size without dominant nodules.  Superior pole vessels were dissected out and divided between small and medium Ligaclips with the Harmonic scalpel.  Superior parathyroid was identified and preserved.  Inferior venous tributaries were divided between medium Ligaclips with the Harmonic scalpel.  The right thyroid lobe was rolled anteriorly and the branches of the inferior thyroid artery divided between small Ligaclips.  The right recurrent laryngeal nerve was identified and preserved along its course.  The ligament of Allyson Sabal was released with the electrocautery.  The right thyroid lobe was mobilized onto the anterior trachea and the remainder of the thyroid was dissected off the anterior trachea and  the thyroid was completely excised.  A suture was used to mark the left lobe. The entire thyroid gland was submitted to pathology for review.  The left  paratracheal space was explored.  A soft mass was identified posterior and inferior to the clavicle on the left.  This mass was gently dissected out and mobilized.  It appeared to be thyroid goiter.  Vascular structures were divided between ligaclips with the harmonic scalpel.  The mass was gently mobilized and elevated from its position posterior to the aortic arch.  The entire mass was delivered into the left thyroid bed and remaining vascular structures were divided with the harmonic scalpel.  Dry pack was placed into the mediastinum.  All packs were removed and the neck was irrigated with warm saline.  Fibrillar was placed throughout the operative field.  Strap muscles were reapproximated in the midline with interrupted 3-0 Vicryl sutures.  Platysma was closed with interrupted 3-0 Vicryl sutures.  Skin was closed with a running 4-0 Monocryl subcuticular suture.  Wound was washed and dried and steri-strips were applied.  Dry gauze dressing was placed.  The patient was awakened from anesthesia and brought to the recovery room.  The patient tolerated the procedure well.   Darnell Level, MD [facility] Office: [phone]

## 2018-07-17 NOTE — Transfer of Care (Signed)
Immediate Anesthesia Transfer of Care Note  Patient: Hector Welch.  Procedure(s) Performed: TOTAL THYROIDECTOMY (N/A Neck)  Patient Location: PACU  Anesthesia Type:General  Level of Consciousness: awake and patient cooperative  Airway & Oxygen Therapy: Patient Spontanous Breathing and Patient connected to face mask oxygen  Post-op Assessment: Report given to RN, Post -op Vital signs reviewed and stable and Patient moving all extremities X 4  Post vital signs: Reviewed and stable  Last Vitals:  Vitals Value Taken Time  BP 188/102 07/17/2018 11:33 AM  Temp    Pulse 64 07/17/2018 11:34 AM  Resp 9 07/17/2018 11:34 AM  SpO2 98 % 07/17/2018 11:34 AM  Vitals shown include unvalidated device data.  Last Pain:  Vitals:   07/17/18 0817  TempSrc:   PainSc: 0-No pain         Complications: No apparent anesthesia complications

## 2018-07-18 ENCOUNTER — Encounter (HOSPITAL_COMMUNITY): Payer: Self-pay | Admitting: General Practice

## 2018-07-18 ENCOUNTER — Other Ambulatory Visit: Payer: Self-pay

## 2018-07-18 DIAGNOSIS — Z7982 Long term (current) use of aspirin: Secondary | ICD-10-CM | POA: Diagnosis not present

## 2018-07-18 DIAGNOSIS — Z87891 Personal history of nicotine dependence: Secondary | ICD-10-CM | POA: Diagnosis not present

## 2018-07-18 DIAGNOSIS — H409 Unspecified glaucoma: Secondary | ICD-10-CM | POA: Diagnosis not present

## 2018-07-18 DIAGNOSIS — M1990 Unspecified osteoarthritis, unspecified site: Secondary | ICD-10-CM | POA: Diagnosis not present

## 2018-07-18 DIAGNOSIS — Z79899 Other long term (current) drug therapy: Secondary | ICD-10-CM | POA: Diagnosis not present

## 2018-07-18 DIAGNOSIS — E042 Nontoxic multinodular goiter: Secondary | ICD-10-CM | POA: Diagnosis not present

## 2018-07-18 LAB — BASIC METABOLIC PANEL
ANION GAP: 10 (ref 5–15)
BUN: 14 mg/dL (ref 8–23)
CALCIUM: 9.2 mg/dL (ref 8.9–10.3)
CO2: 21 mmol/L — ABNORMAL LOW (ref 22–32)
Chloride: 103 mmol/L (ref 98–111)
Creatinine, Ser: 1.18 mg/dL (ref 0.61–1.24)
GFR, EST NON AFRICAN AMERICAN: 59 mL/min — AB (ref >60)
Glucose, Bld: 115 mg/dL — ABNORMAL HIGH (ref 70–99)
Potassium: 3.9 mmol/L (ref 3.5–5.1)
SODIUM: 134 mmol/L — AB (ref 135–145)

## 2018-07-18 MED ORDER — CALCIUM CARBONATE ANTACID 500 MG PO CHEW: 2.0000 | CHEWABLE_TABLET | Freq: Two times a day (BID) | ORAL | 1 refills | Status: DC

## 2018-07-18 MED ORDER — HYDROCODONE-ACETAMINOPHEN 5-325 MG PO TABS: 1.0000 | ORAL_TABLET | ORAL | 0 refills | Status: DC | PRN

## 2018-07-18 MED ORDER — LEVOTHYROXINE SODIUM 100 MCG PO TABS: 100.0000 ug | ORAL_TABLET | Freq: Every day | ORAL | 11 refills | Status: DC

## 2018-07-18 NOTE — Plan of Care (Signed)
  Problem: Education: Goal: Knowledge of General Education information will improve Description Including pain rating scale, medication(s)/side effects and non-pharmacologic comfort measures Outcome: Adequate for Discharge   Problem: Health Behavior/Discharge Planning: Goal: Ability to manage health-related needs will improve Outcome: Adequate for Discharge   Problem: Clinical Measurements: Goal: Ability to maintain clinical measurements within normal limits will improve Outcome: Adequate for Discharge Goal: Will remain free from infection Outcome: Adequate for Discharge Goal: Diagnostic test results will improve Outcome: Adequate for Discharge Goal: Respiratory complications will improve Outcome: Adequate for Discharge Goal: Cardiovascular complication will be avoided Outcome: Adequate for Discharge   Problem: Activity: Goal: Risk for activity intolerance will decrease Outcome: Adequate for Discharge   Problem: Nutrition: Goal: Adequate nutrition will be maintained Outcome: Adequate for Discharge   Problem: Coping: Goal: Level of anxiety will decrease Outcome: Adequate for Discharge   Problem: Elimination: Goal: Will not experience complications related to bowel motility Outcome: Adequate for Discharge Goal: Will not experience complications related to urinary retention Outcome: Adequate for Discharge   Problem: Pain Managment: Goal: General experience of comfort will improve Outcome: Adequate for Discharge   Problem: Safety: Goal: Ability to remain free from injury will improve Outcome: Adequate for Discharge   Problem: Skin Integrity: Goal: Risk for impaired skin integrity will decrease Outcome: Adequate for Discharge   Problem: Surgery Discharge Goal: Knowledge of Patient Safety will improve Outcome: Adequate for Discharge   Problem: Infection: Goal: Surgical site without signs of infection Outcome: Adequate for Discharge

## 2018-07-18 NOTE — Progress Notes (Signed)
Discharged home accompanied by patient's wife. Personal belongings,prescription,discharged instructions given to patient. Verbalized understanding of the instructions

## 2018-07-18 NOTE — Discharge Summary (Signed)
Physician Discharge Summary [facility]  Patient ID: [name] MRN: [id number] DOB/AGE: 73-Mar-1947 73 y.o.  Admit date: 07/17/2018 Discharge date: 07/18/2018  Admission Diagnoses:  Substernal thyroid goiter  Discharge Diagnoses:  Principal Problem:   Substernal thyroid goiter   Discharged Condition: good  Hospital Course: Patient was admitted for observation following thyroid surgery.  Post op course was uncomplicated.  Pain was well controlled.  Tolerated diet.  Post op calcium level on morning following surgery was 9.2 mg/dl.  Patient was prepared for discharge home on POD#1.  Consults: None  Treatments: surgery: total thyroidectomy with cervical resection of substernal goiter  Discharge Exam: Blood pressure 130/69, pulse 60, temperature 98.5 F (36.9 C), temperature source Oral, resp. rate 16, height 5\' 10"  (1.778 m), weight 74.8 kg, SpO2 99 %. HEENT - clear Neck - wound dry and intact; mild STS; voice normal Chest - clear bilaterally Cor - RRR  Disposition: Home  Discharge Instructions    Diet - low sodium heart healthy   Complete by:  As directed    Discharge instructions   Complete by:  As directed    [facility]  THYROID & PARATHYROID SURGERY:  POST-OP INSTRUCTIONS  Always review your discharge instruction sheet from the facility where your surgery was performed.  A prescription for pain medication may be given to you upon discharge.  Take your pain medication as prescribed.  If narcotic pain medicine is not needed, then you may take acetaminophen (Tylenol) or ibuprofen (Advil) as needed.  Take your usually prescribed medications unless otherwise directed.  If you need a refill on your pain medication, please contact our office during regular business hours.  Prescriptions cannot be processed by our office after 5 pm or on weekends.  Start with a light diet upon arrival home, such as soup and crackers or toast.   Be sure to drink plenty of fluids daily.  Resume your normal diet the day after surgery.  Most patients will experience some swelling and bruising on the chest and neck area.  Ice packs will help.  Swelling and bruising can take several days to resolve.   It is common to experience some constipation after surgery.  Increasing fluid intake and taking a stool softener (Colace) will usually help or prevent this problem.  A mild laxative (Milk of Magnesia or Miralax) should be taken according to package directions if there has been no bowel movement after 48 hours.  You have steri-strips and a gauze dressing over your incision.  You may remove the gauze bandage on the second day after surgery, and you may shower at that time.  Leave your steri-strips (small skin tapes) in place directly over the incision.  These strips should remain on the skin for 5-7 days and then be removed.  You may get them wet in the shower and pat them dry.  You may resume regular (light) daily activities beginning the next day (such as daily self-care, walking, climbing stairs) gradually increasing activities as tolerated.  You may have sexual intercourse when it is comfortable.  Refrain from any heavy lifting or straining until approved by your doctor.  You may drive when you no longer are taking prescription pain medication, you can comfortably wear a seatbelt, and you can safely maneuver your car and apply brakes.  You should see your doctor in the office for a follow-up appointment approximately three weeks after your surgery.  Make sure that you call for this  appointment within a day or two after you arrive home to insure a convenient appointment time.  WHEN TO CALL YOUR DOCTOR: -- Fever greater than 101.5 -- Inability to urinate -- Nausea and/or vomiting - persistent -- Extreme swelling or bruising -- Continued bleeding from incision -- Increased pain, redness, or drainage from the incision -- Difficulty swallowing or  breathing -- Muscle cramping or spasms -- Numbness or tingling in hands or around lips  The clinic staff is available to answer your questions during regular business hours.  Please don't hesitate to call and ask to speak to one of the nurses if you have concerns.  Darnell Level, MD [facility] Office: [phone]   Increase activity slowly   Complete by:  As directed    No dressing needed   Complete by:  As directed      Allergies as of 07/18/2018   No Known Allergies     Medication List    TAKE these medications   aspirin EC 81 MG tablet Take 81 mg by mouth daily.   atorvastatin 20 MG tablet Commonly known as:  LIPITOR Take 20 mg by mouth daily.   calcium carbonate 500 MG chewable tablet Commonly known as:  TUMS - dosed in mg elemental calcium Chew 2 tablets (400 mg of elemental calcium total) by mouth 2 (two) times daily.   HYDROcodone-acetaminophen 5-325 MG tablet Commonly known as:  NORCO/VICODIN Take 1-2 tablets by mouth every 4 (four) hours as needed for moderate pain.   levothyroxine 100 MCG tablet Commonly known as:  SYNTHROID, LEVOTHROID Take 1 tablet (100 mcg total) by mouth daily.   nebivolol 10 MG tablet Commonly known as:  BYSTOLIC Take 10 mg by mouth at bedtime.      Follow-up Information    Darnell Level, MD. Schedule an appointment as soon as possible for a visit in 3 week(s).   Specialty:  General Surgery Contact information: [address] [place] [address] [phone]           Velora Heckler, MD, [facility] Office: [phone]   Signed: Velora Heckler 07/18/2018, 2:24 PM

## 2018-07-18 NOTE — Anesthesia Postprocedure Evaluation (Signed)
Anesthesia Post Note  Patient: Hector Welch.  Procedure(s) Performed: TOTAL THYROIDECTOMY (N/A Neck)     Patient location during evaluation: PACU Anesthesia Type: General Level of consciousness: awake and alert Pain management: pain level controlled Vital Signs Assessment: post-procedure vital signs reviewed and stable Respiratory status: spontaneous breathing, nonlabored ventilation, respiratory function stable and patient connected to nasal cannula oxygen Cardiovascular status: blood pressure returned to baseline and stable Postop Assessment: no apparent nausea or vomiting Anesthetic complications: no    Last Vitals:  Vitals:   07/18/18 0217 07/18/18 0505  BP: 124/79 130/69  Pulse: 62 60  Resp: 18 16  Temp: 37.2 C 36.9 C  SpO2: 98% 99%    Last Pain:  Vitals:   07/18/18 0843  TempSrc:   PainSc: 8    Pain Goal: Patients Stated Pain Goal: 2 (07/18/18 0843)               Trevor Iha

## 2018-07-19 NOTE — Progress Notes (Signed)
Please contact patient and notify of benign pathology results.  Karla Pavone M. Eiden Bagot, MD, FACS [facility] Office: [phone]   

## 2018-08-02 DIAGNOSIS — E890 Postprocedural hypothyroidism: Secondary | ICD-10-CM | POA: Diagnosis not present

## 2018-08-25 DIAGNOSIS — E890 Postprocedural hypothyroidism: Secondary | ICD-10-CM | POA: Diagnosis not present

## 2018-09-05 ENCOUNTER — Ambulatory Visit: Payer: Medicare Other | Admitting: Student

## 2018-09-13 ENCOUNTER — Encounter: Payer: Self-pay | Admitting: Family Medicine

## 2018-09-13 DIAGNOSIS — E042 Nontoxic multinodular goiter: Secondary | ICD-10-CM | POA: Diagnosis not present

## 2018-09-13 DIAGNOSIS — M25571 Pain in right ankle and joints of right foot: Secondary | ICD-10-CM | POA: Diagnosis not present

## 2018-09-13 DIAGNOSIS — N401 Enlarged prostate with lower urinary tract symptoms: Secondary | ICD-10-CM | POA: Diagnosis not present

## 2018-09-13 DIAGNOSIS — M25521 Pain in right elbow: Secondary | ICD-10-CM | POA: Diagnosis not present

## 2018-09-13 DIAGNOSIS — I10 Essential (primary) hypertension: Secondary | ICD-10-CM | POA: Diagnosis not present

## 2018-10-24 IMAGING — US US THYROID
1 series · 12 of 25 positions shown · non-contrast
Comparison: 04/01/2018 chest CT

CLINICAL DATA: Multinodular goiter by carotid ultrasound and chest
CT

EXAM:
THYROID ULTRASOUND
TECHNIQUE: Ultrasound examination of the thyroid gland and adjacent soft
tissues was performed.

[Series 1: us thyroid · 0.07mm/px · 12 of 91 slices shown]
[im 4/91]
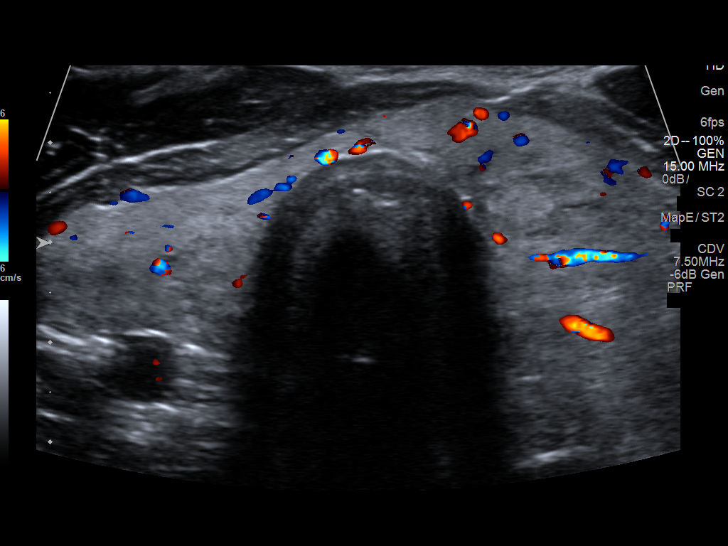
[im 12/91]
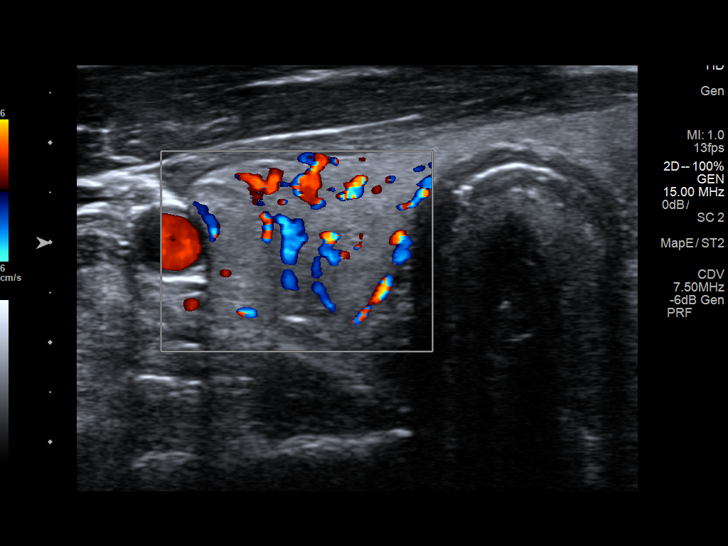
[im 19/91]
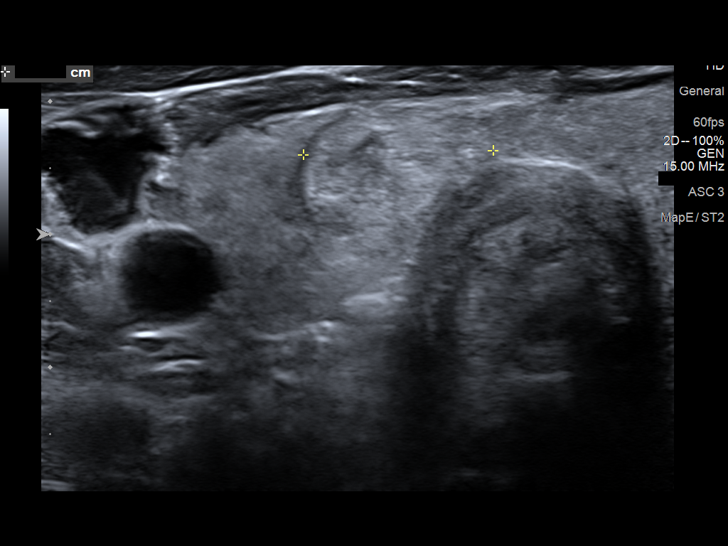
[im 27/91]
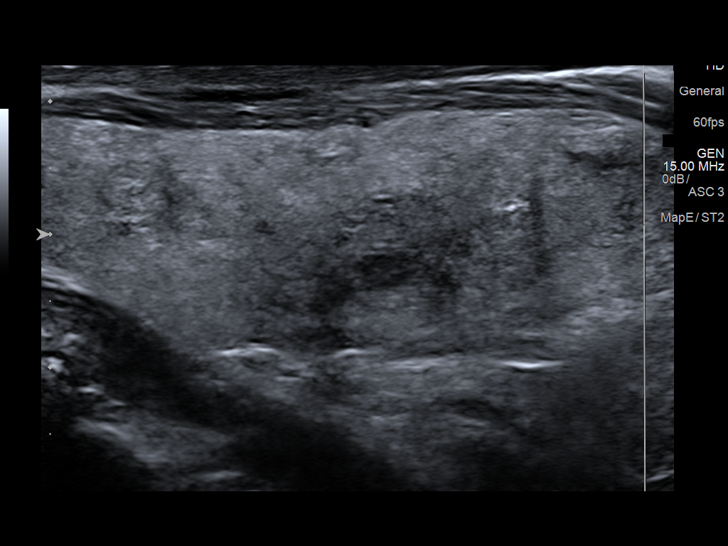
[im 34/91]
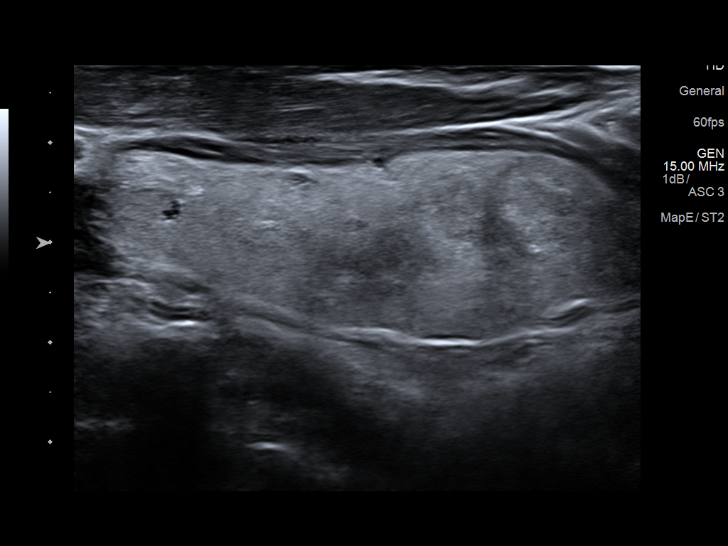
[im 42/91]
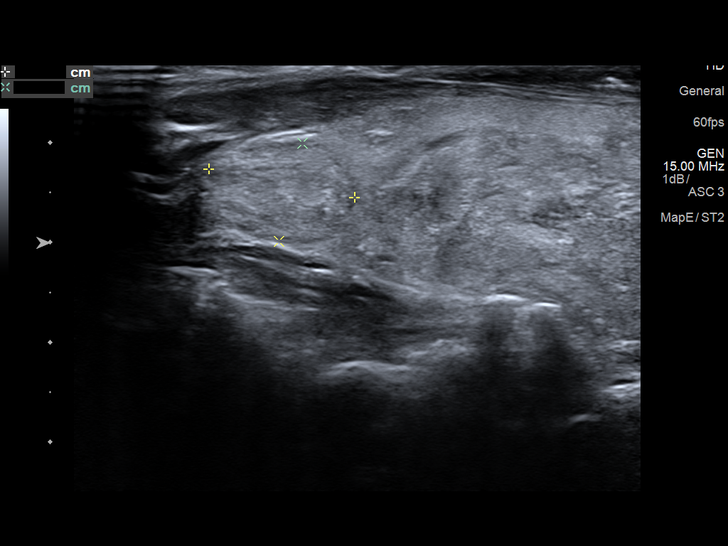
[im 49/91]
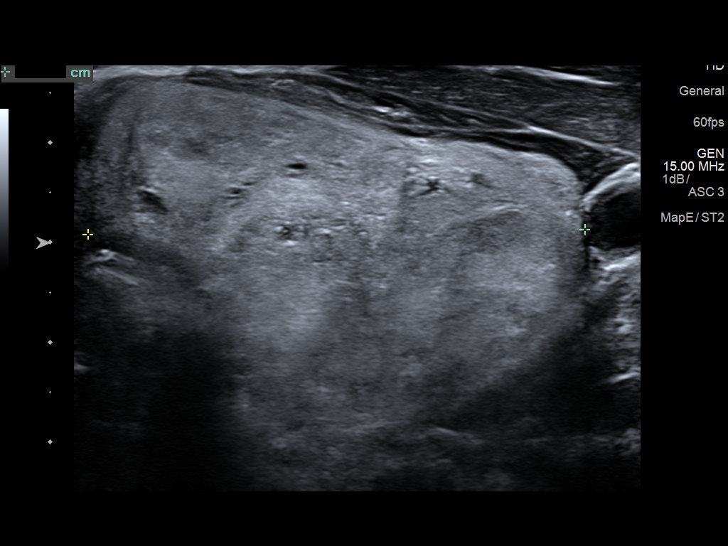
[im 57/91]
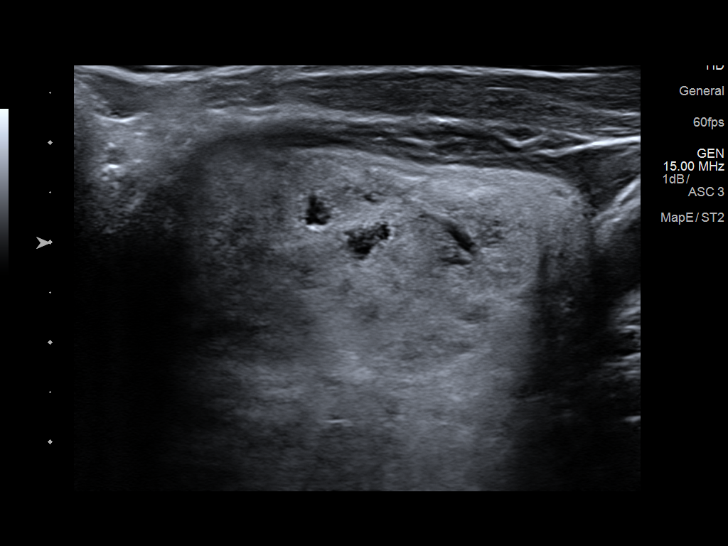
[im 64/91]
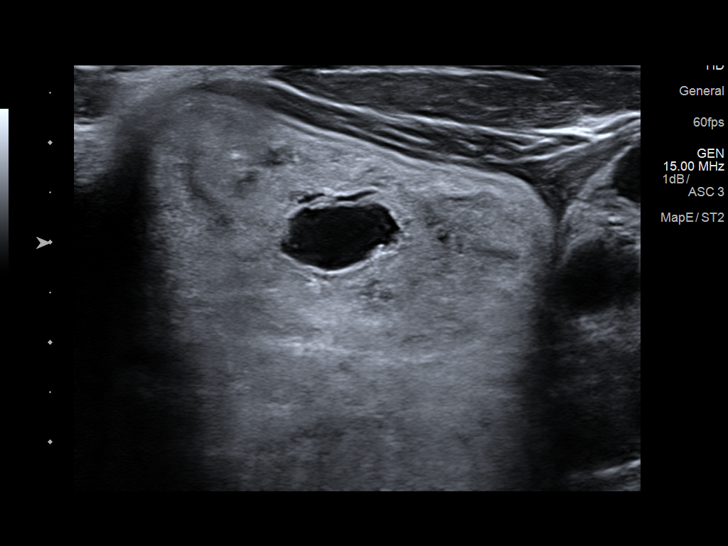
[im 72/91]
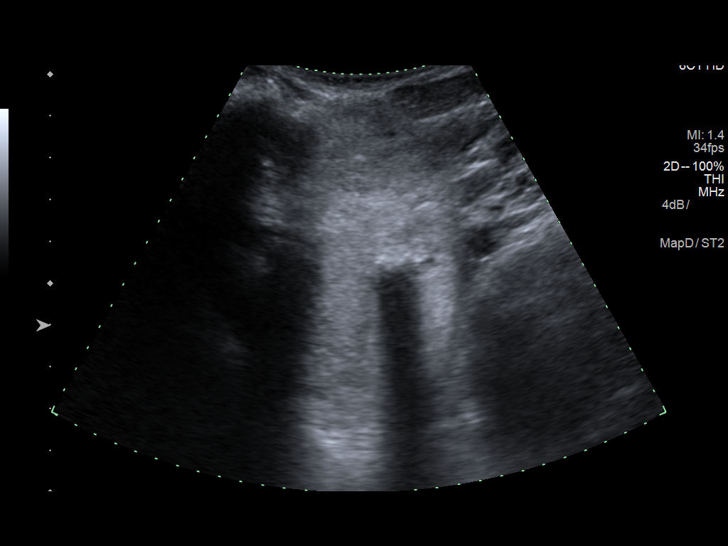
[im 79/91]
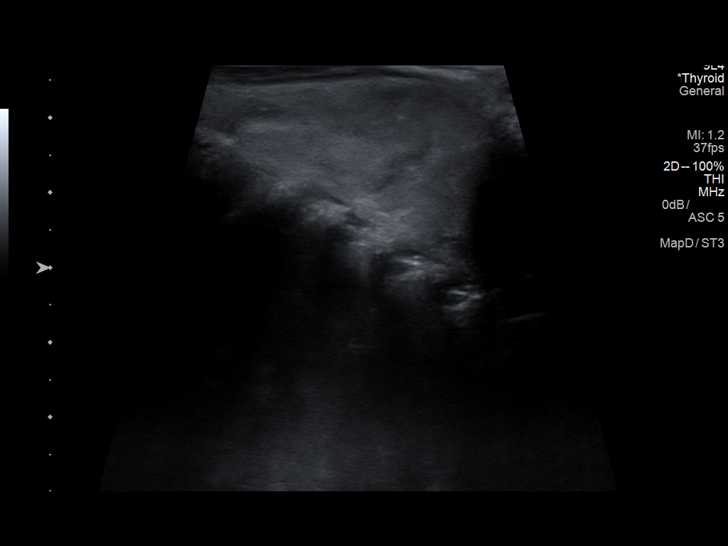
[im 87/91]
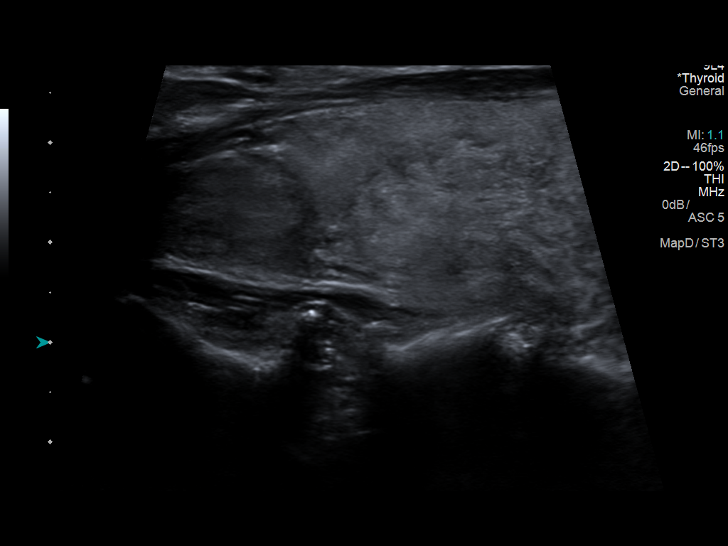

[12 of 25 positions shown; findings below may reference images not displayed]

FINDINGS: Parenchymal Echotexture: Moderately heterogenous

Isthmus: 3.8 cm

Right lobe: 6.0 x 1.8 x 2.1 cm

Left lobe: 13.1 x 4.1 x 5.0 cm

_________________________________________________________

Estimated total number of nodules >/= 1 cm: 6-10

Number of spongiform nodules >/=  2 cm not described below (TR1): 0

Number of mixed cystic and solid nodules >/= 1.5 cm not described
below (TR2): 0

_________________________________________________________

Nodule # 3:

Location: Right; Inferior

Maximum size: 1.4 cm; Other 2 dimensions: 1.3 x 1.0 cm

Composition: solid/almost completely solid (2)

Echogenicity: isoechoic (1)

Shape: not taller-than-wide (0)

Margins: ill-defined (0)

Echogenic foci: none (0)

ACR TI-RADS total points: 3.

ACR TI-RADS risk category: TR3 (3 points).

ACR TI-RADS recommendations:

Given size (<1.4 cm) and appearance, this nodule does NOT meet
TI-RADS criteria for biopsy or dedicated follow-up.

_________________________________________________________

There are 2 additional right thyroid nodules, 1 is solid isoechoic
and a second nodule is mixed cystic/solid. These nodules measure
cm or less in size and would not meet criteria for any biopsy or
follow-up as well.

Nodule # 4:

Location: Left; Superior

Maximum size: 1.5 cm; Other 2 dimensions: 1.0 x 1.0 cm

Composition: solid/almost completely solid (2)

Echogenicity: isoechoic (1)

Shape: not taller-than-wide (0)

Margins: ill-defined (0)

Echogenic foci: none (0)

ACR TI-RADS total points: 3.

ACR TI-RADS risk category: TR3 (3 points).

ACR TI-RADS recommendations:

*Given size (>/= 1.5 - 2.4 cm) and appearance, a follow-up
ultrasound in 1 year should be considered based on TI-RADS criteria.

_________________________________________________________

Nodule # 5:

Location: Left; Mid

Maximum size: 4.1 cm; Other 2 dimensions: 2.5 x 2.9 cm

Composition: solid/almost completely solid (2)

Echogenicity: isoechoic (1)

Shape: not taller-than-wide (0)

Margins: ill-defined (0)

Echogenic foci: none (0)

ACR TI-RADS total points: 3.

ACR TI-RADS risk category: TR3 (3 points).

ACR TI-RADS recommendations:

**Given size (>/= 2.5 cm) and appearance, fine needle aspiration of
this mildly suspicious nodule should be considered based on TI-RADS
criteria.

_________________________________________________________

Nodule # 6:

Location: Left; Inferior

Maximum size: 3.8 cm; Other 2 dimensions: 2.2 x 2.9 cm

Composition: mixed cystic and solid (1)

Echogenicity: isoechoic (1)

Shape: not taller-than-wide (0)

Margins: ill-defined (0)

Echogenic foci: macrocalcifications (1)

ACR TI-RADS total points: 3.

ACR TI-RADS risk category: TR3 (3 points).

ACR TI-RADS recommendations:

**Given size (>/= 2.5 cm) and appearance, fine needle aspiration of
this mildly suspicious nodule should be considered based on TI-RADS
criteria.

_________________________________________________________

Nodule # 7:

Location: Left; Inferior

Maximum size: 5.6 cm; Other 2 dimensions: 4.7 X 3.7 cm

Composition: solid/almost completely solid (2)

Echogenicity: isoechoic (1)

Shape: taller-than-wide (3)

Margins: ill-defined (0)

Echogenic foci: macrocalcifications (1)

ACR TI-RADS total points: 7.

ACR TI-RADS risk category: TR5 (>/= 7 points).

ACR TI-RADS recommendations:

**Given size (>/= 1.0 cm) and appearance, fine needle aspiration of
this highly suspicious nodule should be considered based on TI-RADS
criteria.

_________________________________________________________

No adenopathy
IMPRESSION: Heterogeneous enlarged multinodular thyroid as above, favored to
represent goiter.

Recommend biopsy of the 2 most suspicious left thyroid nodules,
nodules 5 and 7.

The above is in keeping with the ACR TI-RADS recommendations - [HOSPITAL] 1259;[DATE].

## 2019-05-07 DIAGNOSIS — E785 Hyperlipidemia, unspecified: Secondary | ICD-10-CM | POA: Diagnosis not present

## 2019-05-07 DIAGNOSIS — Z0000 Encounter for general adult medical examination without abnormal findings: Secondary | ICD-10-CM | POA: Diagnosis not present

## 2019-05-07 DIAGNOSIS — G459 Transient cerebral ischemic attack, unspecified: Secondary | ICD-10-CM | POA: Diagnosis not present

## 2019-05-07 DIAGNOSIS — M25571 Pain in right ankle and joints of right foot: Secondary | ICD-10-CM | POA: Diagnosis not present

## 2019-05-07 DIAGNOSIS — R079 Chest pain, unspecified: Secondary | ICD-10-CM | POA: Diagnosis not present

## 2019-05-07 DIAGNOSIS — Z125 Encounter for screening for malignant neoplasm of prostate: Secondary | ICD-10-CM | POA: Diagnosis not present

## 2019-05-07 DIAGNOSIS — N138 Other obstructive and reflux uropathy: Secondary | ICD-10-CM | POA: Diagnosis not present

## 2019-05-07 DIAGNOSIS — E042 Nontoxic multinodular goiter: Secondary | ICD-10-CM | POA: Diagnosis not present

## 2019-05-07 DIAGNOSIS — K210 Gastro-esophageal reflux disease with esophagitis: Secondary | ICD-10-CM | POA: Diagnosis not present

## 2019-05-07 DIAGNOSIS — I10 Essential (primary) hypertension: Secondary | ICD-10-CM | POA: Diagnosis not present

## 2019-05-07 LAB — LIPID PANEL
Cholesterol: 131 (ref 0–200)
HDL: 58 (ref 35–70)
LDL Cholesterol: 60
Triglycerides: 53 (ref 40–160)

## 2019-05-07 LAB — COMPREHENSIVE METABOLIC PANEL
Albumin: 3.9 (ref 3.5–5.0)
Calcium: 9 (ref 8.7–10.7)
GFR calc Af Amer: 78
GFR calc non Af Amer: 67
Globulin: 2.7

## 2019-05-07 LAB — BASIC METABOLIC PANEL
BUN: 11 (ref 4–21)
CO2: 29 — AB (ref 13–22)
Chloride: 106 (ref 99–108)
Creatinine: 1.1 (ref <=1.3)
Glucose: 102
Potassium: 4 (ref 3.4–5.3)
Sodium: 139 (ref 137–147)

## 2019-05-07 LAB — PSA: PSA: 3.3

## 2019-05-07 LAB — CBC AND DIFFERENTIAL
HCT: 38 — AB (ref 41–53)
Hemoglobin: 12.9 — AB (ref 13.5–17.5)
Neutrophils Absolute: 1714
Platelets: 142 — AB (ref 150–399)
WBC: 3.6

## 2019-05-07 LAB — HEPATIC FUNCTION PANEL
ALT: 11 (ref 10–40)
AST: 18 (ref 14–40)
Alkaline Phosphatase: 53 (ref 25–125)

## 2019-05-07 LAB — CBC: RBC: 3.92 (ref 3.87–5.11)

## 2019-05-07 LAB — TSH: TSH: 0.01 — AB (ref <=5.90)

## 2019-05-16 DIAGNOSIS — Z1211 Encounter for screening for malignant neoplasm of colon: Secondary | ICD-10-CM | POA: Diagnosis not present

## 2019-05-16 DIAGNOSIS — Z1212 Encounter for screening for malignant neoplasm of rectum: Secondary | ICD-10-CM | POA: Diagnosis not present

## 2019-05-16 LAB — COLOGUARD: Cologuard: NEGATIVE

## 2019-05-17 ENCOUNTER — Other Ambulatory Visit: Payer: Self-pay

## 2019-06-06 IMAGING — CT CT ANGIO CHEST
2 of 6 series · 18 of 46 positions shown · IV contrast (Isovue)
Comparison: CXR 04/01/2018 neck CT 12/16/2017

CLINICAL DATA: Left-sided chest pain starting this morning. Sharp
pain has radiated to the right shoulder since last evening.

EXAM:
CT ANGIOGRAPHY CHEST WITH CONTRAST
TECHNIQUE: Multidetector CT imaging of the chest was performed using the
standard protocol during bolus administration of intravenous
contrast. Multiplanar CT image reconstructions and MIPs were
obtained to evaluate the vascular anatomy.
CONTRAST:  80mL D0P2IS-IKS IOPAMIDOL (D0P2IS-IKS) INJECTION 76%

[Series 5: thins · axial · 0.70mm/px · z∈[-329,-15]mm · 15 of 344 slices shown]
[im 15/344  lung]
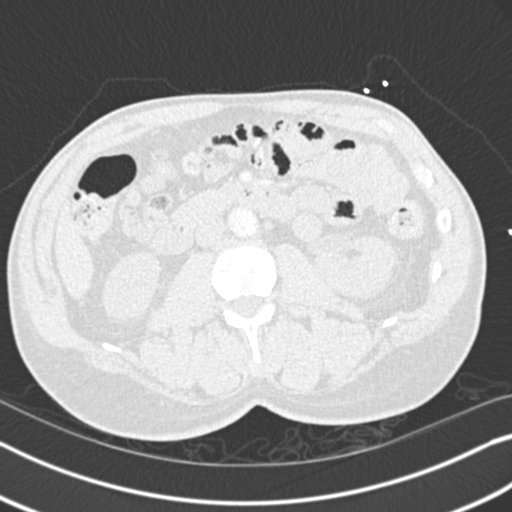
[im 45/344  soft-tissue]
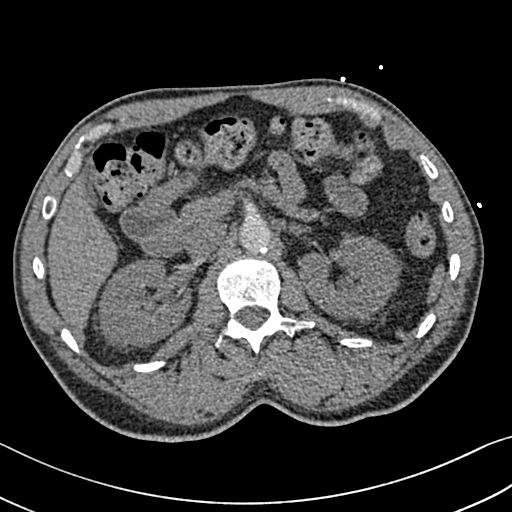
[im 60/344  lung]
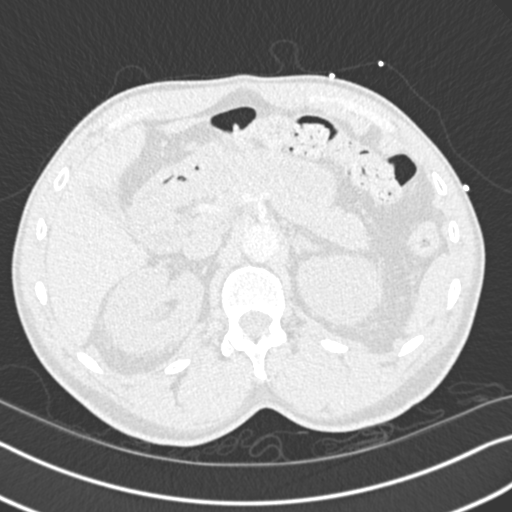
[im 90/344  soft-tissue]
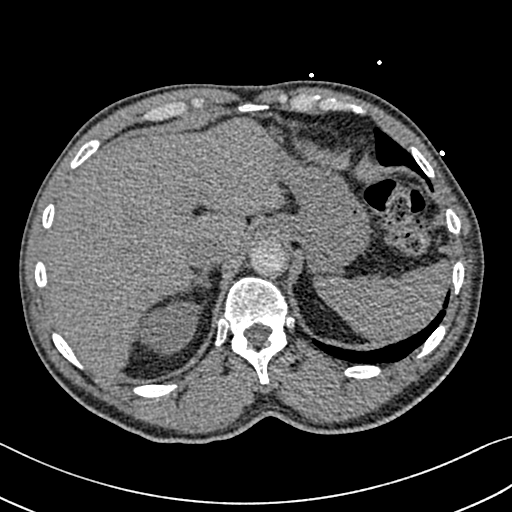
[im 105/344  lung]
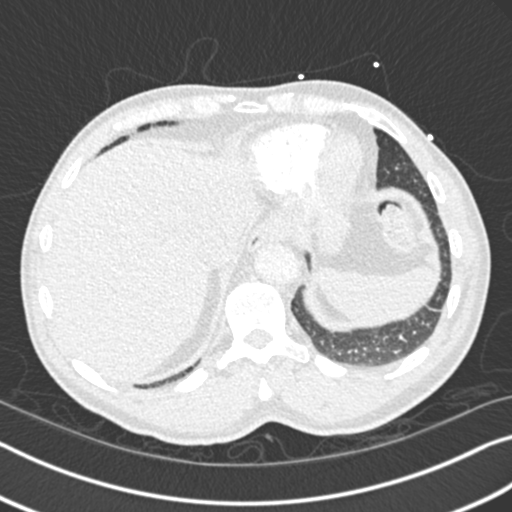
[im 135/344  soft-tissue]
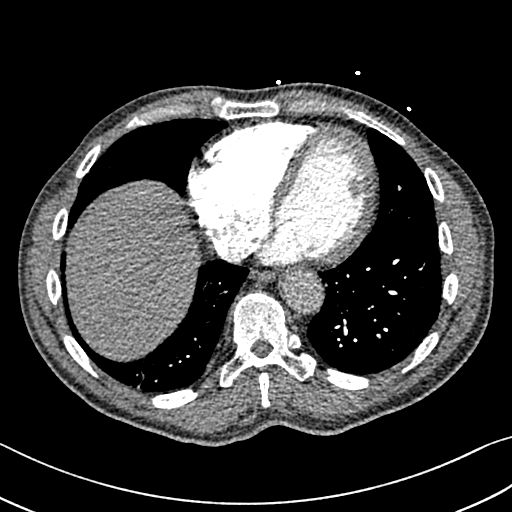
[im 150/344  lung]
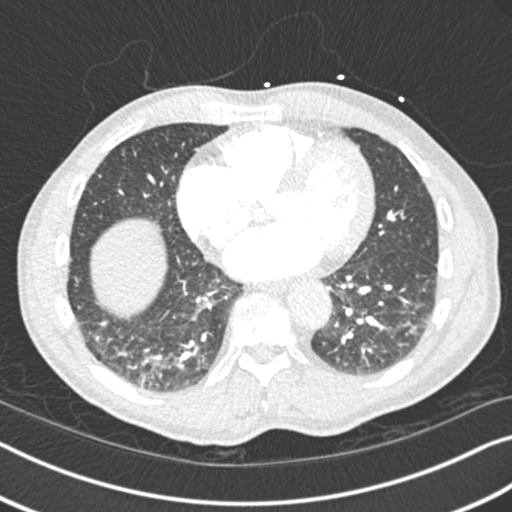
[im 179/344  soft-tissue]
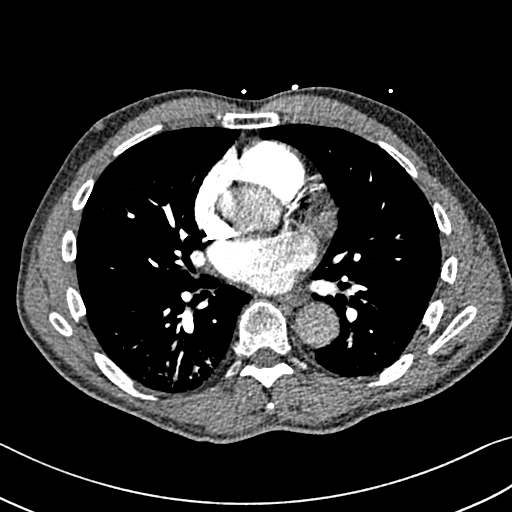
[im 194/344  lung]
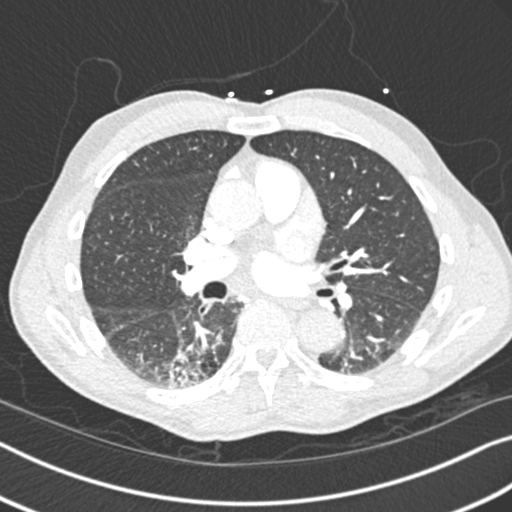
[im 209/344  soft-tissue]
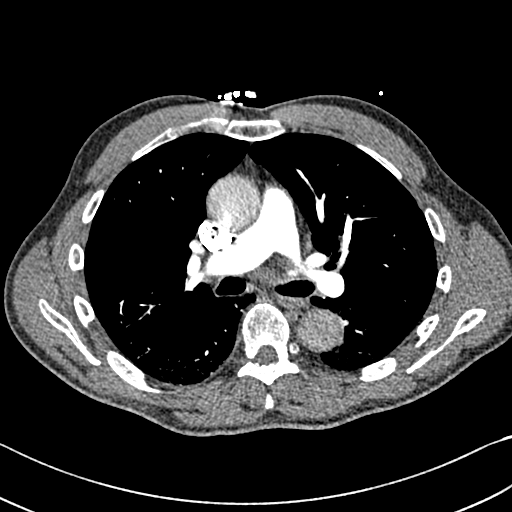
[im 239/344  lung]
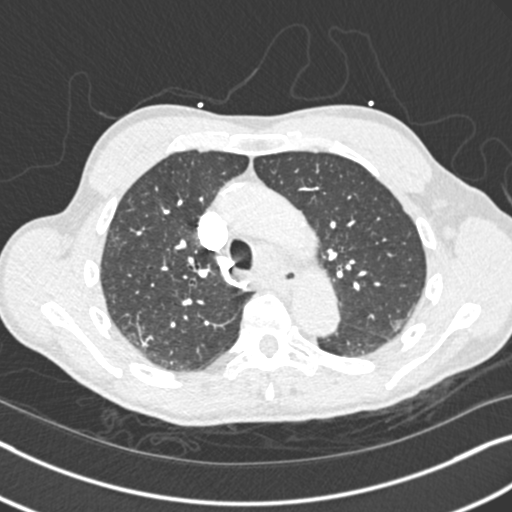
[im 254/344  soft-tissue]
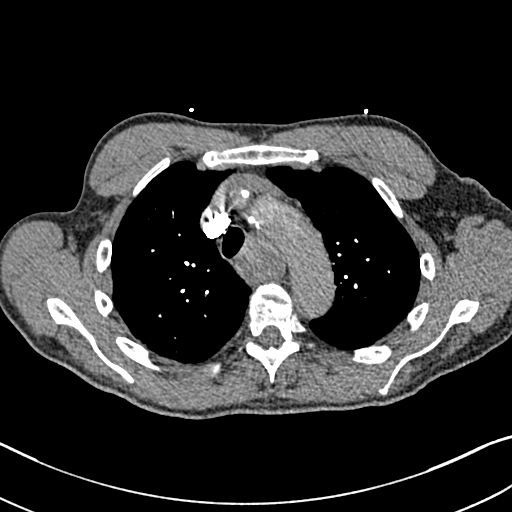
[im 284/344  lung]
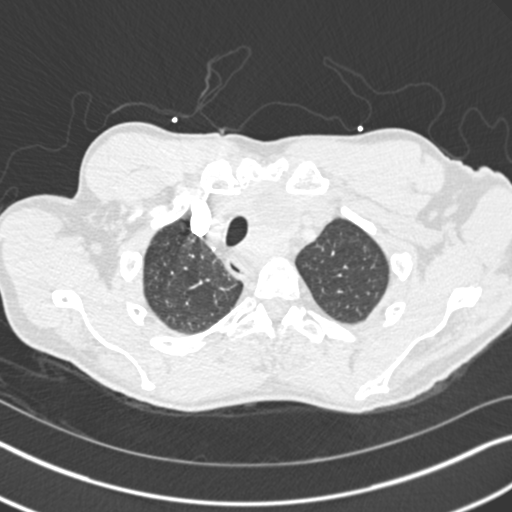
[im 299/344  soft-tissue]
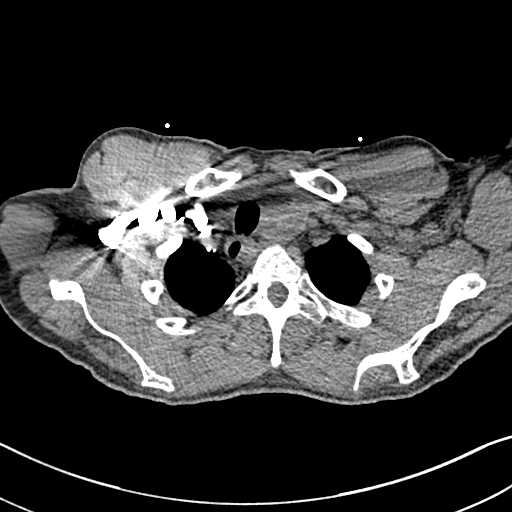
[im 329/344  lung]
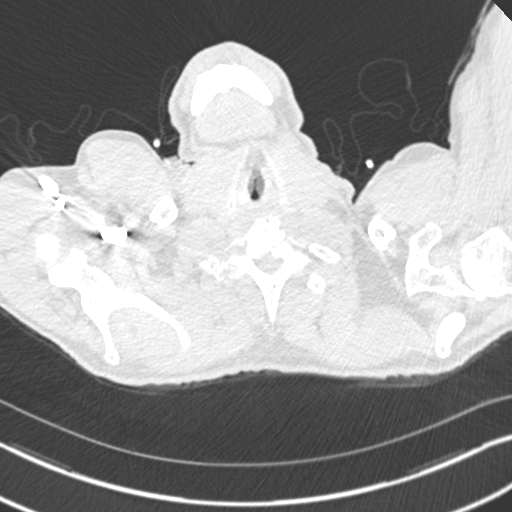

[Series 7: coronal mpr · coronal · 0.70mm/px · 3 of 125 slices shown]
[im 32/125  soft-tissue]
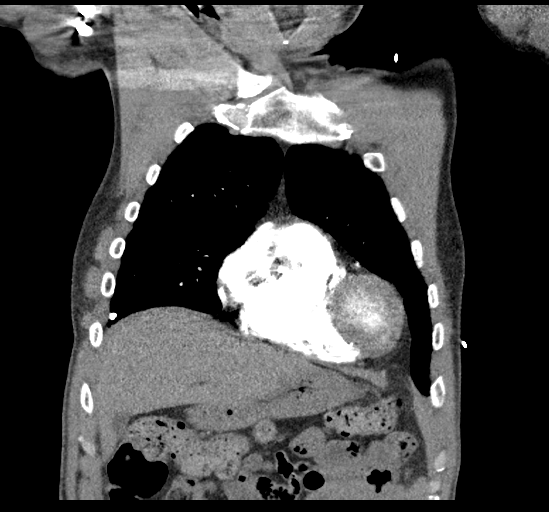
[im 63/125  soft-tissue]
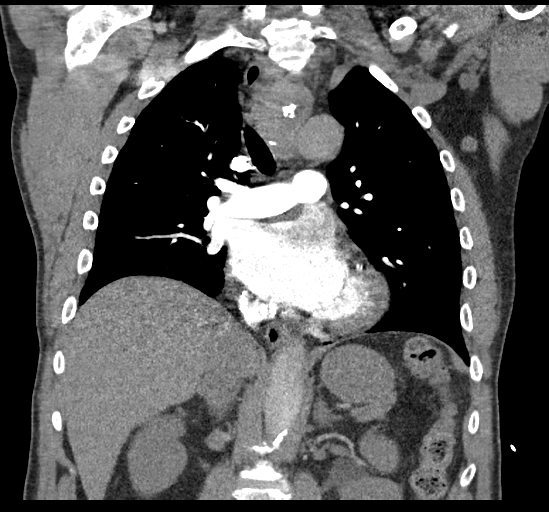
[im 94/125  soft-tissue]
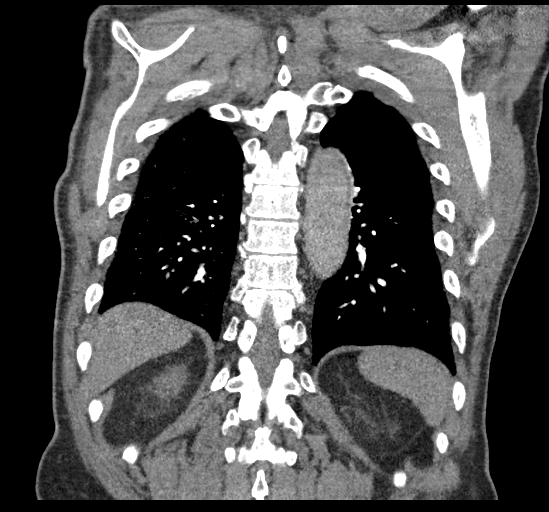

[18 of 46 positions shown; findings below may reference images not displayed]

FINDINGS: Cardiovascular: Conventional branch pattern of the great vessels
with atherosclerotic origins in its of the right brachiocephalic and
subclavian arteries. Nonaneurysmal mild atherosclerosis of the
thoracic aorta. Preferential opacification of the pulmonary arteries
without pulmonary embolus to the segmental level. Left main and
three-vessel coronary arteriosclerosis. Normal size heart without
pericardial effusion or thickening.

Mediastinum/Nodes: Stable substernal goiter with heterogeneous
enhancement and calcifications extending into the superior
mediastinum and precarinal region. This deviates the trachea and
esophagus to the right as before. This measures up to 5.3 x 3.9 cm
on series [DATE] and is without significant interval change. Patent
trachea and mainstem bronchi. No mediastinal, hilar nor axillary
adenopathy.

Lungs/Pleura: Bilateral lower lobe subpleural atelectasis and
scarring are identified, right greater than left. Subpleural areas
of tree-in-bud opacities are also present compatible with
bronchiolitis. No pneumonia, effusion or pneumothorax. No dominant
mass.

Upper Abdomen: Bilateral extrarenal pelves with nonspecific
perinephric fat stranding. No obstructive uropathy or
nephrolithiasis. Normal bilateral adrenal glands, spleen, included
pancreas and liver. Nondistended gallbladder is noted, free of
stones.

Musculoskeletal: Mid to lower thoracic spondylosis with degenerative
disc disease and endplate spurring.

Review of the MIP images confirms the above findings.
IMPRESSION: 1. Redemonstration of large substernal goiter deviating the trachea
and esophagus to the right.
2. Bibasilar subpleural atelectasis and scarring, right greater than
left with superimposed tree-in-bud opacities compatible with
bronchiolitis.
3. No acute pulmonary embolus.
4. Nonaneurysmal thoracic aorta.
5. Left main and three-vessel coronary arteriosclerosis.

Aortic Atherosclerosis ([id number]-E46.6).

## 2019-06-06 IMAGING — DX DG CHEST 2V
2 series · 2 of 2 positions shown · non-contrast
Comparison: None.

CLINICAL DATA: Chest pain

EXAM:
CHEST - 2 VIEW

[chest lat]
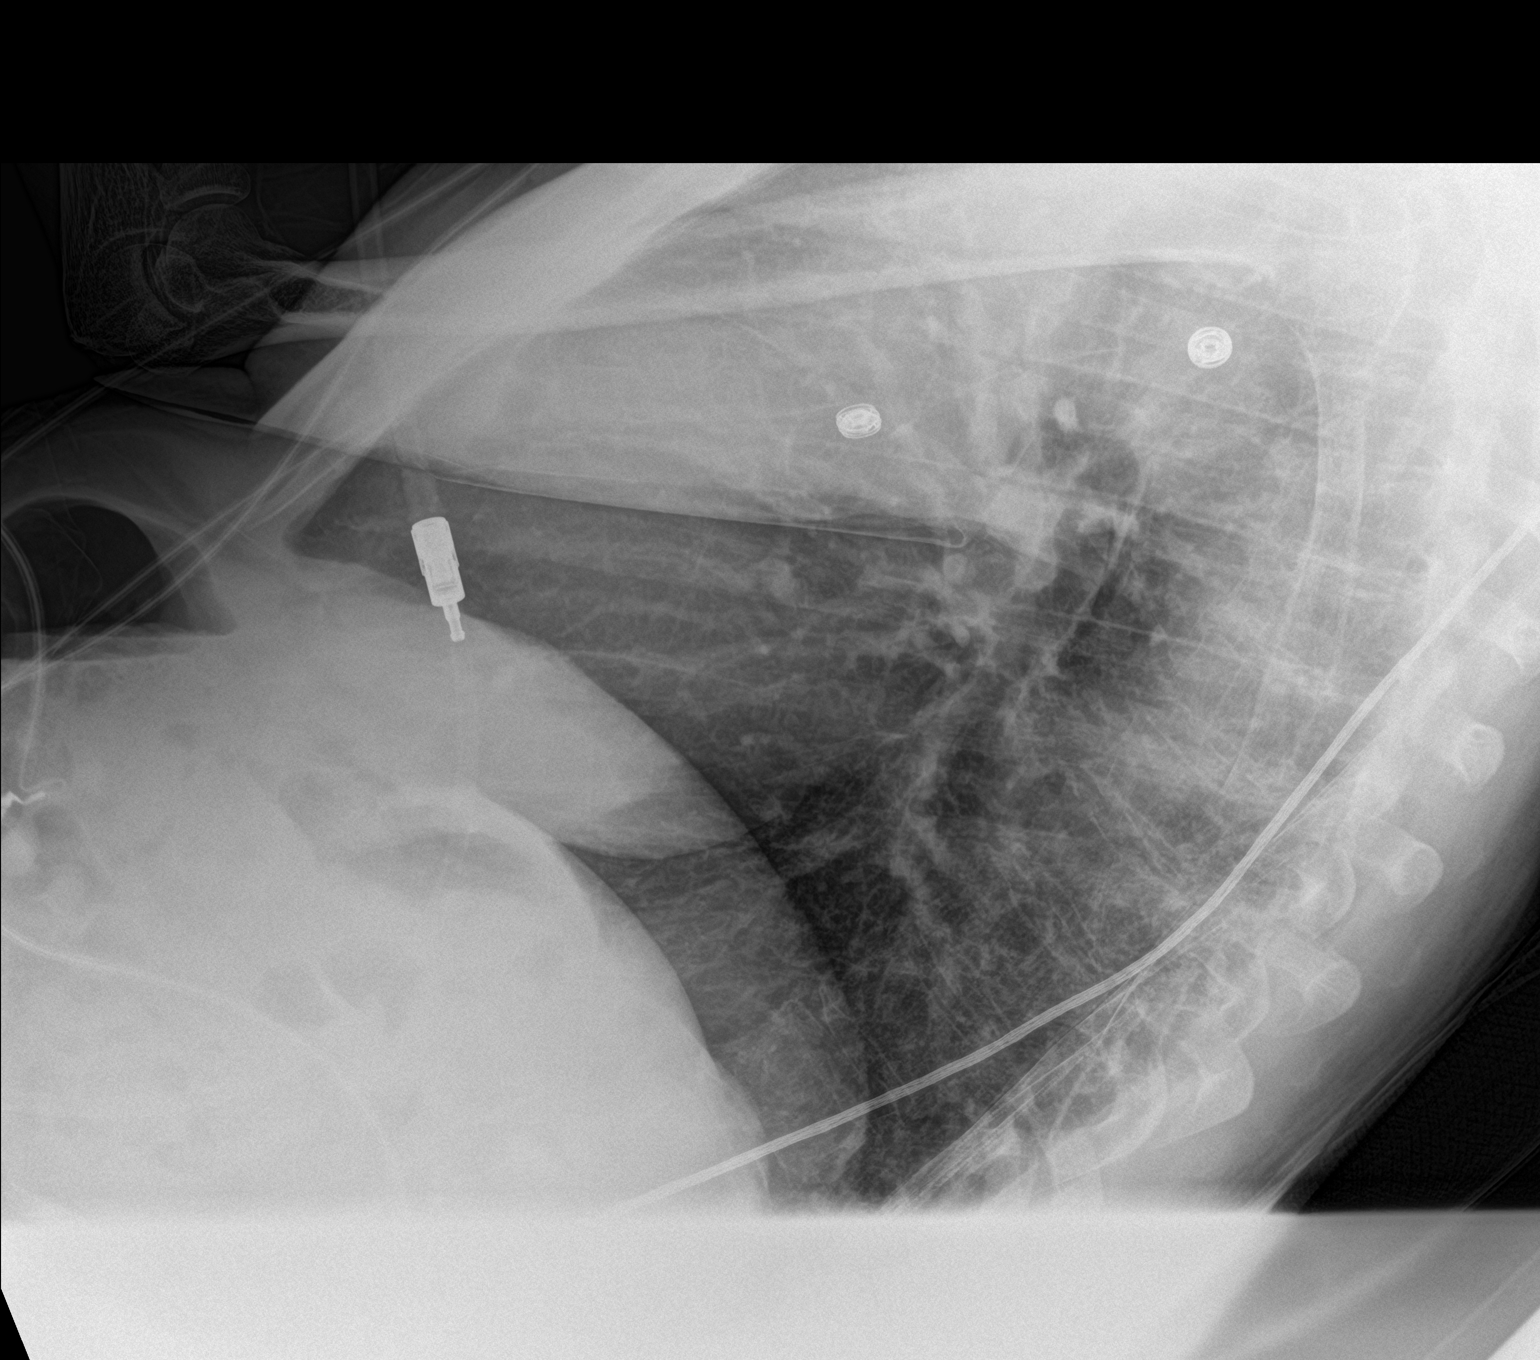

[chest ap]
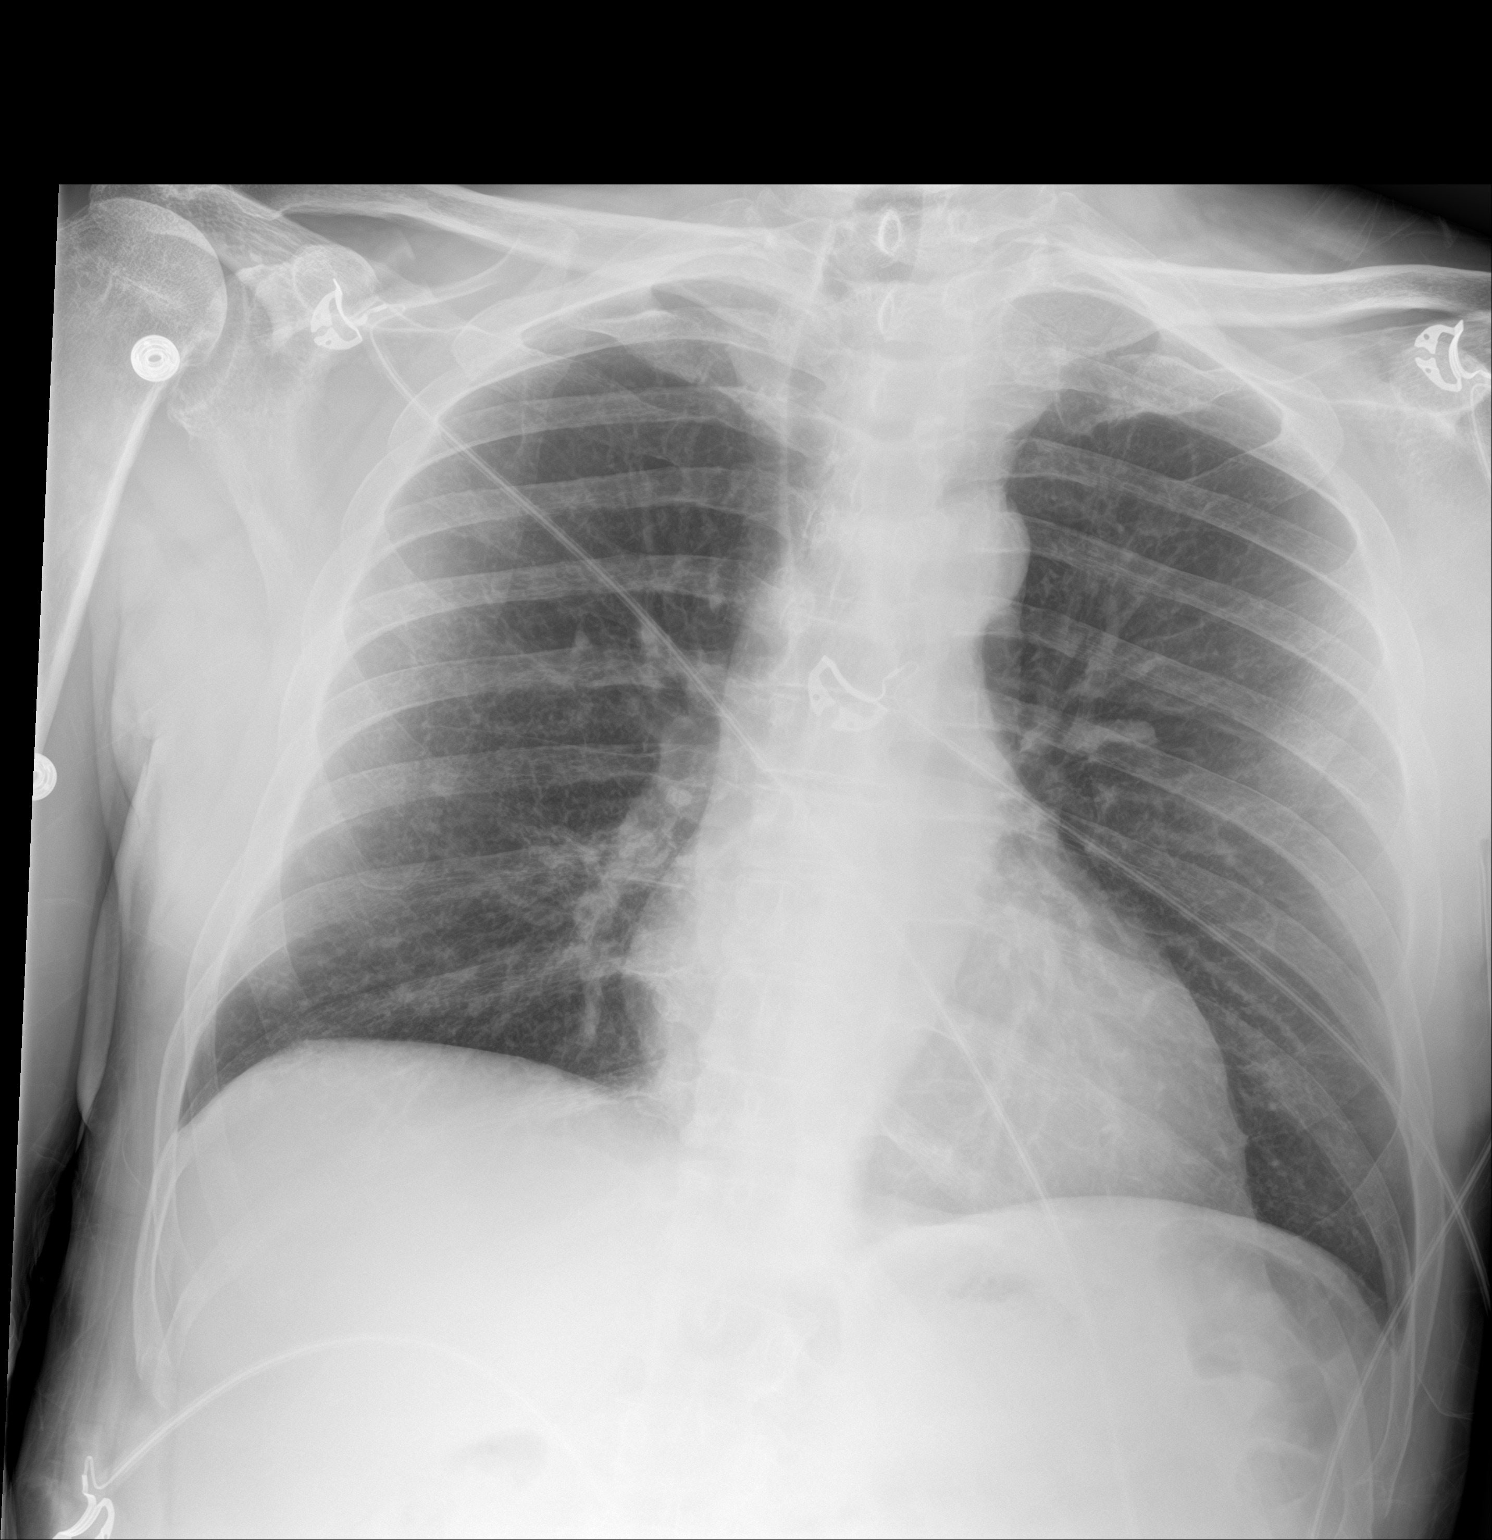

[2 of 2 positions shown; findings below may reference images not displayed]

FINDINGS: Heart and mediastinal contours are within normal limits. No focal
opacities or effusions. No acute bony abnormality.
IMPRESSION: No active cardiopulmonary disease.

## 2019-07-09 DIAGNOSIS — Z23 Encounter for immunization: Secondary | ICD-10-CM | POA: Diagnosis not present

## 2019-09-21 IMAGING — CR DG CHEST 2V
2 series · 2 of 2 positions shown · non-contrast
Comparison: 04/01/2018

CLINICAL DATA: Preop thyroidectomy.  Hypertension

EXAM:
CHEST - 2 VIEW

[w chest pa]
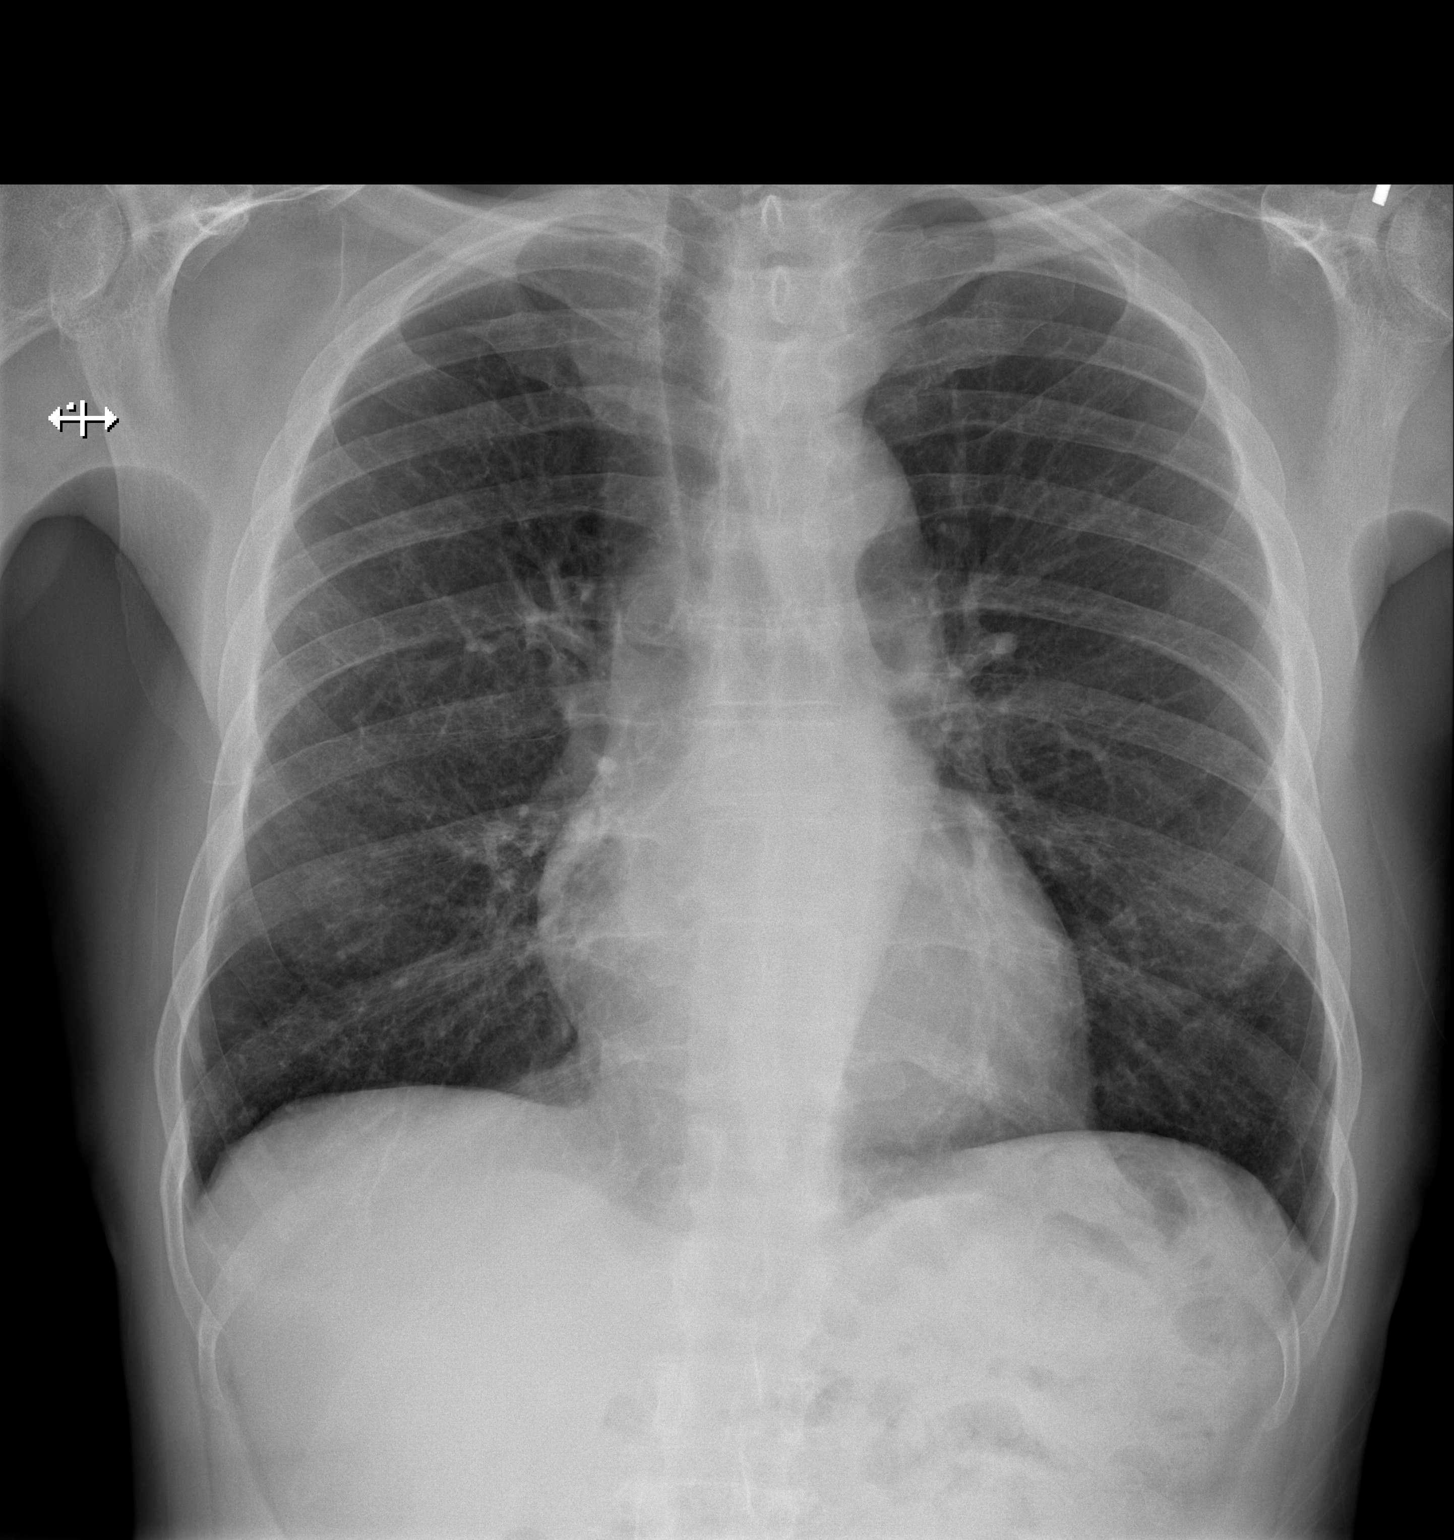

[w chest lat]
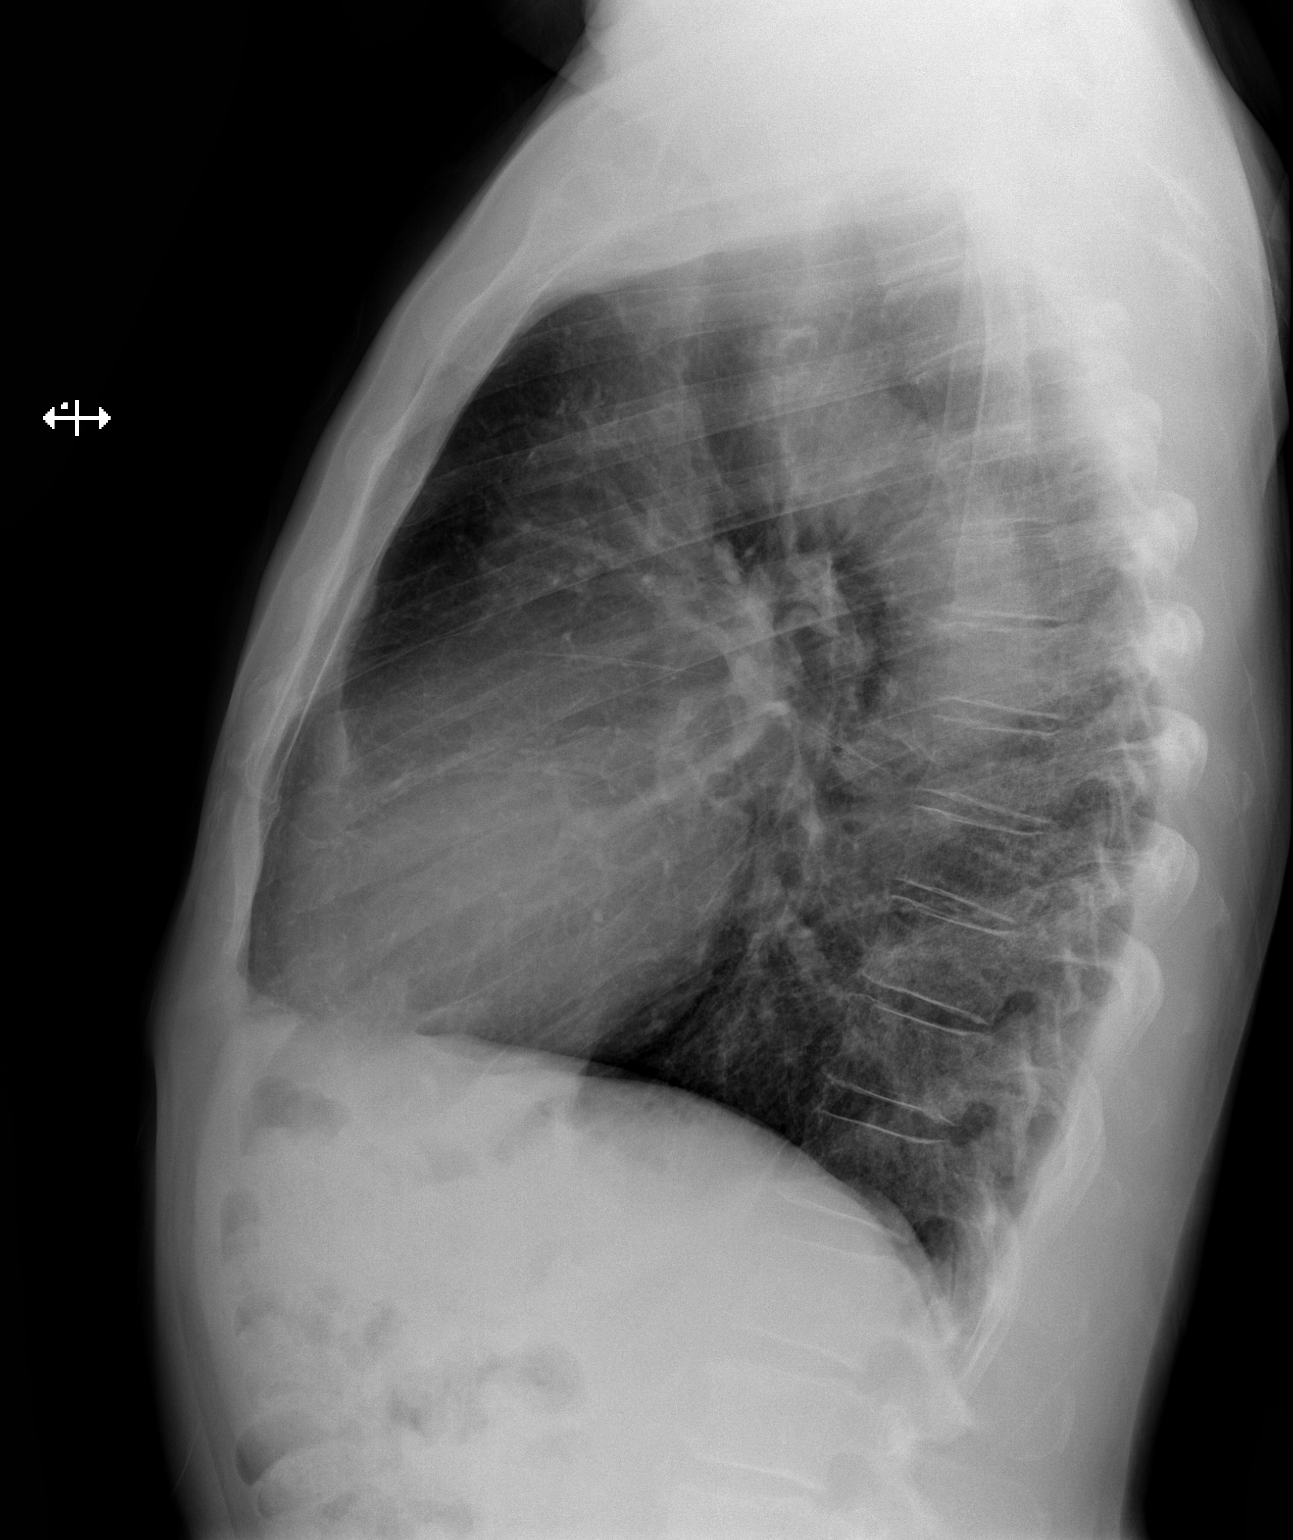

[2 of 2 positions shown; findings below may reference images not displayed]

FINDINGS: Heart and mediastinal contours are within normal limits. No focal
opacities or effusions. No acute bony abnormality.
IMPRESSION: No active cardiopulmonary disease.

## 2019-09-23 DIAGNOSIS — E785 Hyperlipidemia, unspecified: Secondary | ICD-10-CM

## 2019-09-23 DIAGNOSIS — R079 Chest pain, unspecified: Secondary | ICD-10-CM

## 2019-09-23 DIAGNOSIS — N138 Other obstructive and reflux uropathy: Secondary | ICD-10-CM

## 2019-09-23 DIAGNOSIS — I10 Essential (primary) hypertension: Secondary | ICD-10-CM

## 2019-09-23 DIAGNOSIS — G459 Transient cerebral ischemic attack, unspecified: Secondary | ICD-10-CM

## 2019-09-23 DIAGNOSIS — H409 Unspecified glaucoma: Secondary | ICD-10-CM

## 2019-09-23 DIAGNOSIS — E042 Nontoxic multinodular goiter: Secondary | ICD-10-CM

## 2019-09-23 DIAGNOSIS — M25571 Pain in right ankle and joints of right foot: Secondary | ICD-10-CM | POA: Insufficient documentation

## 2019-09-23 DIAGNOSIS — K2100 Gastro-esophageal reflux disease with esophagitis, without bleeding: Secondary | ICD-10-CM

## 2019-09-23 LAB — URIC ACID: Uric Acid: 4.9

## 2019-11-06 ENCOUNTER — Other Ambulatory Visit: Payer: Self-pay

## 2019-11-06 ENCOUNTER — Ambulatory Visit: Payer: Medicare Other | Attending: Internal Medicine

## 2019-11-06 DIAGNOSIS — Z20822 Contact with and (suspected) exposure to covid-19: Secondary | ICD-10-CM

## 2019-11-07 LAB — NOVEL CORONAVIRUS, NAA: SARS-CoV-2, NAA: NOT DETECTED

## 2019-11-26 ENCOUNTER — Encounter: Payer: Self-pay | Admitting: Family Medicine

## 2019-11-26 ENCOUNTER — Other Ambulatory Visit: Payer: Self-pay

## 2019-11-26 ENCOUNTER — Ambulatory Visit (INDEPENDENT_AMBULATORY_CARE_PROVIDER_SITE_OTHER): Payer: Medicare Other | Admitting: Family Medicine

## 2019-11-26 VITALS — BP 162/99 | HR 58 | Temp 97.8°F | Ht 70.0 in | Wt 168.0 lb

## 2019-11-26 DIAGNOSIS — I10 Essential (primary) hypertension: Secondary | ICD-10-CM

## 2019-11-26 DIAGNOSIS — K2100 Gastro-esophageal reflux disease with esophagitis, without bleeding: Secondary | ICD-10-CM

## 2019-11-26 DIAGNOSIS — E785 Hyperlipidemia, unspecified: Secondary | ICD-10-CM

## 2019-11-26 DIAGNOSIS — E039 Hypothyroidism, unspecified: Secondary | ICD-10-CM

## 2019-11-26 NOTE — Progress Notes (Signed)
New Patient Office Visit  Subjective:  Patient ID: [name], male    DOB: [birth year]  Age: 74 y.o. MRN: [id number]  CC:  Chief Complaint  Patient presents with  . Establish Care  . Medication Refill  HTN/Hypothyroid/Hyperlipidemia  HPI Hector Welch. presents for HTN-Bystolic -132/80 on home monitor Hyperlipidemia-atorvastatin 20mg  -lipid panel  Hypothyroid-euthyox 50mg -recent change from 100mg -TSH  Past Medical History:  Diagnosis Date  . Astigmatism   . Cataracts, bilateral   . Cervicalgia   . Cervicalgia   . Chest pain, unspecified   . Essential (primary) hypertension   . Gastro-esophageal reflux disease with esophagitis   . GERD (gastroesophageal reflux disease)   . Glaucoma   . Hyperlipidemia, unspecified   . Hypertension   . Mini stroke (HCC) 12/2017  . Nontoxic multinodular goiter   . Osteoarthritis   . Other obstructive and reflux uropathy   . Pain in joint involving shoulder region   . Pain in right ankle and joints of right foot   . Thyroid goiter   . Transient cerebral ischemic attack, unspecified   . Unspecified glaucoma     Past Surgical History:  Procedure Laterality Date  . APPENDECTOMY  APH, 60's  . COLONOSCOPY N/A 12/05/2012   Procedure: COLONOSCOPY;  Surgeon: , MD;  Location: [facility];  Service: Endoscopy;  Laterality: N/A;  1:00 PM  . DENTAL SURGERY    . THYROIDECTOMY  07/17/2018  . THYROIDECTOMY N/A 07/17/2018   Procedure: TOTAL THYROIDECTOMY;  Surgeon: Corbin Ade, MD;  Location: [facility] OR;  Service: General;  Laterality: N/A;    Family History  Problem Relation Age of Onset  . Cancer Mother   . Cancer Father   . Cancer Sister   . Cancer Brother     Social History   Socioeconomic History  . Marital status: Married    Spouse name: Not on file  . Number of children: 4  . Years of education: 67  . Highest education level: Not on file  Occupational History  . Occupation: Retired  Tobacco Use  . Smoking  status: Former Smoker    Quit date: 1999    Years since quitting: 22.1  . Smokeless tobacco: Never Used  . Tobacco comment: was a light smoker  Substance and Sexual Activity  . Alcohol use: No  . Drug use: No  . Sexual activity: Not Currently  Other Topics Concern  . Not on file  Social History Narrative   Lives w/ wife   Caffeine use: Coffee daily   Right handed    Social Determinants of Health   Financial Resource Strain:   . Difficulty of Paying Living Expenses: Not on file  Food Insecurity:   . Worried About 07/19/2018 in the Last Year: Not on file  . Ran Out of Food in the Last Year: Not on file  Transportation Needs:   . Lack of Transportation (Medical): Not on file  . Lack of Transportation (Non-Medical): Not on file  Physical Activity:   . Days of Exercise per Week: Not on file  . Minutes of Exercise per Session: Not on file  Stress:   . Feeling of Stress : Not on file  Social Connections:   . Frequency of Communication with Friends and Family: Not on file  . Frequency of Social Gatherings with Friends and Family: Not on file  . Attends Religious Services: Not on file  . Active Member of Clubs or Organizations: Not on  file  . Attends Archivist Meetings: Not on file  . Marital Status: Not on file  Intimate Partner Violence:   . Fear of Current or Ex-Partner: Not on file  . Emotionally Abused: Not on file  . Physically Abused: Not on file  . Sexually Abused: Not on file    ROS Review of Systems  Eyes:       Cataracts glaucoma  Respiratory: Negative.   Cardiovascular:       TIA-prior to thyroid surgery  Gastrointestinal:       GERD  Endocrine:       Thyroid enlargement  Genitourinary: Negative.   Musculoskeletal: Positive for arthralgias and myalgias.  Allergic/Immunologic: Negative.   Neurological: Negative.   Hematological: Negative.   Psychiatric/Behavioral: Negative.     Objective:   Today's Vitals: BP (!) 162/99 (BP  Location: Left Arm, Patient Position: Sitting, Cuff Size: Normal)   Pulse (!) 58   Temp 97.8 F (36.6 C) (Oral)   Ht 5\' 10"  (1.778 m)   Wt 168 lb (76.2 kg)   SpO2 99%   BMI 24.11 kg/m  Right arm 142/92 Physical Exam Constitutional:      Appearance: Normal appearance.  HENT:     Head: Normocephalic and atraumatic.  Cardiovascular:     Rate and Rhythm: Regular rhythm.     Pulses: Normal pulses.     Heart sounds: Normal heart sounds.  Pulmonary:     Effort: Pulmonary effort is normal.     Breath sounds: Normal breath sounds.  Musculoskeletal:     Cervical back: Normal range of motion and neck supple.  Neurological:     Mental Status: He is alert and oriented to person, place, and time.  Psychiatric:        Mood and Affect: Mood normal.        Behavior: Behavior normal.     Assessment & Plan:   1. Hyperlipidemia, unspecified hyperlipidemia type Atorvastatin-stable  2. Essential (primary) hypertension Bystolic-142/92 noted on recheck today-continue to monitor  3. Gastroesophageal reflux disease with esophagitis, unspecified whether hemorrhage TUMS-nothing on a regular basis 4. Hypothyroidism, unspecified type Pt changed from 100mg  to 50mg -needs recheck on TSH-previously over-replaced Outpatient Encounter Medications as of 11/26/2019  Medication Sig  . aspirin EC 81 MG tablet Take 81 mg by mouth daily.  Marland Kitchen atorvastatin (LIPITOR) 20 MG tablet Take 20 mg by mouth daily.  Marland Kitchen levothyroxine (EUTHYROX) 50 MCG tablet Take 50 mcg by mouth daily before breakfast.  . nebivolol (BYSTOLIC) 10 MG tablet Take 10 mg by mouth at bedtime.   . calcium carbonate (TUMS) 500 MG chewable tablet Chew 2 tablets (400 mg of elemental calcium total) by mouth 2 (two) times daily.  . [DISCONTINUED] HYDROcodone-acetaminophen (NORCO/VICODIN) 5-325 MG tablet Take 1-2 tablets by mouth every 4 (four) hours as needed for moderate pain. (Patient not taking: Reported on 11/26/2019)  . [DISCONTINUED] levothyroxine  (SYNTHROID) 100 MCG tablet Take 1 tablet (100 mcg total) by mouth daily.   No facility-administered encounter medications on file as of 11/26/2019.    Follow-up: labwork, 6 month f/u  Shadara Lopez Hannah Beat, MD

## 2019-11-26 NOTE — Patient Instructions (Addendum)
COVID-19 Vaccine Information can be found at: [email or web address] For questions related to vaccine distribution or appointments, please email [email or web address] or call [phone].   Blood work  Continue to monitor blood pressure -if increase consistently over 140/90

## 2019-11-27 DIAGNOSIS — I10 Essential (primary) hypertension: Secondary | ICD-10-CM | POA: Diagnosis not present

## 2019-11-27 DIAGNOSIS — E039 Hypothyroidism, unspecified: Secondary | ICD-10-CM | POA: Diagnosis not present

## 2019-11-28 ENCOUNTER — Other Ambulatory Visit: Payer: Self-pay | Admitting: Family Medicine

## 2019-11-28 DIAGNOSIS — E039 Hypothyroidism, unspecified: Secondary | ICD-10-CM

## 2019-11-28 LAB — BASIC METABOLIC PANEL
BUN: 12 mg/dL (ref 7–25)
CO2: 27 mmol/L (ref 20–32)
Calcium: 9.1 mg/dL (ref 8.6–10.3)
Chloride: 107 mmol/L (ref 98–110)
Creat: 1.15 mg/dL (ref 0.70–1.18)
Glucose, Bld: 102 mg/dL (ref 65–139)
Potassium: 4 mmol/L (ref 3.5–5.3)
Sodium: 140 mmol/L (ref 135–146)

## 2019-11-28 LAB — TSH: TSH: 34.49 mIU/L — ABNORMAL HIGH (ref 0.40–4.50)

## 2019-11-28 MED ORDER — LEVOTHYROXINE SODIUM 75 MCG PO TABS: 75.0000 ug | ORAL_TABLET | Freq: Every day | ORAL | 1 refills | Status: DC

## 2019-12-05 ENCOUNTER — Other Ambulatory Visit: Payer: Self-pay

## 2019-12-05 ENCOUNTER — Ambulatory Visit: Payer: Medicare Other | Attending: Internal Medicine

## 2019-12-05 DIAGNOSIS — Z23 Encounter for immunization: Secondary | ICD-10-CM | POA: Insufficient documentation

## 2019-12-05 NOTE — Progress Notes (Signed)
   [id number]  Name:  Hector Welch.    MRN: [id number] DOB: 10/05/1946  12/05/2019  [name] was observed post Covid-19 immunization for 15 minutes without incidence. He was provided with Vaccine Information Sheet and instruction to access the V-Safe system.   [name] was instructed to call 911 with any severe reactions post vaccine: Marland Kitchen Difficulty breathing  . Swelling of your face and throat  . A fast heartbeat  . A bad rash all over your body  . Dizziness and weakness    Immunizations Administered    Name Date Dose VIS Date Route   Moderna COVID-19 Vaccine 12/05/2019  2:00 PM 0.5 mL 09/18/2019 Intramuscular   Manufacturer: Moderna   Lot: [id number]   NDC: 56154-884-57

## 2020-01-02 ENCOUNTER — Ambulatory Visit: Payer: Medicare Other | Attending: Internal Medicine

## 2020-01-02 DIAGNOSIS — Z23 Encounter for immunization: Secondary | ICD-10-CM

## 2020-01-02 NOTE — Progress Notes (Signed)
   [id number]  Name:  Hector Welch.    MRN: [id number] DOB: [birth year]  01/02/2020  [name] was observed post Covid-19 immunization for 15 minutes without incident. He was provided with Vaccine Information Sheet and instruction to access the V-Safe system.   [name] was instructed to call 911 with any severe reactions post vaccine: Marland Kitchen Difficulty breathing  . Swelling of face and throat  . A fast heartbeat  . A bad rash all over body  . Dizziness and weakness   Immunizations Administered    Name Date Dose VIS Date Route   Moderna COVID-19 Vaccine 01/02/2020  1:54 PM 0.5 mL 09/18/2019 Intramuscular   Manufacturer: Moderna   Lot: [id number]   NDC: 00164-290-37

## 2020-01-10 DIAGNOSIS — E039 Hypothyroidism, unspecified: Secondary | ICD-10-CM | POA: Diagnosis not present

## 2020-01-11 LAB — TSH: TSH: 6.17 mIU/L — ABNORMAL HIGH (ref 0.40–4.50)

## 2020-01-14 ENCOUNTER — Other Ambulatory Visit: Payer: Self-pay | Admitting: Family Medicine

## 2020-01-14 MED ORDER — LEVOTHYROXINE SODIUM 88 MCG PO TABS: 88.0000 ug | ORAL_TABLET | Freq: Every day | ORAL | 3 refills | Status: DC

## 2020-01-16 ENCOUNTER — Telehealth: Payer: Self-pay | Admitting: Emergency Medicine

## 2020-01-16 NOTE — Telephone Encounter (Signed)
-----   Message from Wandra Feinstein, MD sent at 01/14/2020 12:19 PM EDT ----- Pt needs to increase dose to daily. Recheck TSH in 6weeks-important to see if additional medication needed.

## 2020-01-16 NOTE — Telephone Encounter (Signed)
Patient was informed and made aware of the mead increase to 

## 2020-02-04 ENCOUNTER — Ambulatory Visit: Payer: Medicare Other | Admitting: Family Medicine

## 2020-02-18 ENCOUNTER — Other Ambulatory Visit: Payer: Self-pay

## 2020-02-18 ENCOUNTER — Ambulatory Visit (INDEPENDENT_AMBULATORY_CARE_PROVIDER_SITE_OTHER): Payer: Medicare Other | Admitting: Family Medicine

## 2020-02-18 ENCOUNTER — Encounter: Payer: Self-pay | Admitting: Family Medicine

## 2020-02-18 VITALS — BP 164/96 | HR 53 | Temp 97.6°F | Ht 70.5 in | Wt 165.6 lb

## 2020-02-18 DIAGNOSIS — E039 Hypothyroidism, unspecified: Secondary | ICD-10-CM

## 2020-02-18 DIAGNOSIS — I10 Essential (primary) hypertension: Secondary | ICD-10-CM

## 2020-02-18 DIAGNOSIS — E785 Hyperlipidemia, unspecified: Secondary | ICD-10-CM | POA: Diagnosis not present

## 2020-02-18 NOTE — Patient Instructions (Signed)
TSH -labwork Check blood pressure first thing in the morning-goal 130/980-if consistently elevated-call for appointment to start additional blood pressure medication

## 2020-02-18 NOTE — Progress Notes (Signed)
Established Patient Office Visit  Subjective:  Patient ID: Hector Welch., male    DOB: 1946/08/31  Age: 74 y.o. MRN: [id number]  CC:  Chief Complaint  Patient presents with  . Hyperlipidemia    3 month f/u    HPI [place] [employer]. presents for hypothyroid-synthroid-TSH-6.17-need repeat HTN-bystolic--bp at home normal range per pt -renal fuction 2/21-normal Hyperlipidemia-lipitor daily 7/20-LDL 60  Past Medical History:  Diagnosis Date  . Astigmatism   . Cataracts, bilateral   . Cervicalgia   . Cervicalgia   . Chest pain, unspecified   . Essential (primary) hypertension   . Gastro-esophageal reflux disease with esophagitis   . GERD (gastroesophageal reflux disease)   . Glaucoma   . Hyperlipidemia, unspecified   . Hypertension   . Mini stroke (HCC) 12/2017  . Nontoxic multinodular goiter   . Osteoarthritis   . Other obstructive and reflux uropathy   . Pain in joint involving shoulder region   . Pain in right ankle and joints of right foot   . Thyroid goiter   . Transient cerebral ischemic attack, unspecified   . Unspecified glaucoma     Past Surgical History:  Procedure Laterality Date  . APPENDECTOMY  APH, 60's  . COLONOSCOPY N/A 12/05/2012   Procedure: COLONOSCOPY;  Surgeon: Corbin Ade, MD;  Location: [facility];  Service: Endoscopy;  Laterality: N/A;  1:00 PM  . DENTAL SURGERY    . THYROIDECTOMY  07/17/2018  . THYROIDECTOMY N/A 07/17/2018   Procedure: TOTAL THYROIDECTOMY;  Surgeon: Darnell Level, MD;  Location: [facility] OR;  Service: General;  Laterality: N/A;    Family History  Problem Relation Age of Onset  . Cancer Mother   . Cancer Father   . Cancer Sister   . Cancer Brother     Social History   Socioeconomic History  . Marital status: Married    Spouse name: Not on file  . Number of children: 4  . Years of education: 72  . Highest education level: Not on file  Occupational History  . Occupation: Retired  Tobacco Use  . Smoking status:  Former Smoker    Quit date: 1999    Years since quitting: 22.3  . Smokeless tobacco: Never Used  . Tobacco comment: was a light smoker  Substance and Sexual Activity  . Alcohol use: No  . Drug use: No  . Sexual activity: Not Currently  Other Topics Concern  . Not on file  Social History Narrative   Lives w/ wife   Caffeine use: Coffee daily   Right handed    Social Determinants of Health   Financial Resource Strain:   . Difficulty of Paying Living Expenses:   Food Insecurity:   . Worried About Programme researcher, broadcasting/film/video in the Last Year:   . Barista in the Last Year:   Transportation Needs:   . Freight forwarder (Medical):   Marland Kitchen Lack of Transportation (Non-Medical):   Physical Activity:   . Days of Exercise per Week:   . Minutes of Exercise per Session:   Stress:   . Feeling of Stress :   Social Connections:   . Frequency of Communication with Friends and Family:   . Frequency of Social Gatherings with Friends and Family:   . Attends Religious Services:   . Active Member of Clubs or Organizations:   . Attends Banker Meetings:   Marland Kitchen Marital Status:   Intimate Partner Violence:   . Fear  of Current or Ex-Partner:   . Emotionally Abused:   Marland Kitchen Physically Abused:   . Sexually Abused:     Outpatient Medications Prior to Visit  Medication Sig Dispense Refill  . aspirin EC 81 MG tablet Take 81 mg by mouth daily.    Marland Kitchen atorvastatin (LIPITOR) 20 MG tablet Take 20 mg by mouth daily.    . calcium carbonate (TUMS) 500 MG chewable tablet Chew 2 tablets (400 mg of elemental calcium total) by mouth 2 (two) times daily. 90 tablet 1  . levothyroxine (SYNTHROID) 88 MCG tablet Take 1 tablet (88 mcg total) by mouth daily. 90 tablet 3  . nebivolol (BYSTOLIC) 10 MG tablet Take 10 mg by mouth at bedtime.      No facility-administered medications prior to visit.    No Known Allergies  ROS Review of Systems  Constitutional: Negative.   Respiratory: Negative.    Cardiovascular: Negative.   Gastrointestinal: Negative.   Endocrine:       Hypothyroid   Genitourinary: Negative.   Musculoskeletal: Negative.   Psychiatric/Behavioral: Negative.       Objective:    Physical Exam  Constitutional: He is oriented to person, place, and time. He appears well-developed.  HENT:  Head: Normocephalic and atraumatic.  Eyes: Conjunctivae are normal.  Cardiovascular: Normal rate, regular rhythm and normal heart sounds.  Pulmonary/Chest: Effort normal.  Musculoskeletal:        General: Normal range of motion.  Neurological: He is alert and oriented to person, place, and time. He has normal reflexes.  Psychiatric: He has a normal mood and affect. His behavior is normal.    BP (!) 164/96 (BP Location: Left Arm, Patient Position: Sitting)   Pulse (!) 53   Temp 97.6 F (36.4 C) (Temporal)   Ht 5' 10.5" (1.791 m)   Wt 165 lb 9.6 oz (75.1 kg)   SpO2 97%   BMI 23.43 kg/m  Wt Readings from Last 3 Encounters:  02/18/20 165 lb 9.6 oz (75.1 kg)  11/26/19 168 lb (76.2 kg)  07/17/18 165 lb (74.8 kg)     Health Maintenance Due  Topic Date Due  . TETANUS/TDAP  Never done  . PNA vac Low Risk Adult (1 of 2 - PCV13) Never done    There are no preventive care reminders to display for this patient.  Lab Results  Component Value Date   TSH 6.17 (H) 01/10/2020   Lab Results  Component Value Date   WBC 3.6 05/07/2019   HGB 12.9 (A) 05/07/2019   HCT 38 (A) 05/07/2019   MCV 99.5 07/17/2018   PLT 142 (A) 05/07/2019   Lab Results  Component Value Date   NA 140 11/27/2019   K 4.0 11/27/2019   CO2 27 11/27/2019   GLUCOSE 102 11/27/2019   BUN 12 11/27/2019   CREATININE 1.15 11/27/2019   BILITOT 1.0 11/29/2017   ALKPHOS 53 05/07/2019   AST 18 05/07/2019   ALT 11 05/07/2019   PROT 7.1 11/29/2017   ALBUMIN 3.9 05/07/2019   CALCIUM 9.1 11/27/2019   ANIONGAP 10 07/18/2018   Lab Results  Component Value Date   CHOL 131 05/07/2019   Lab Results   Component Value Date   HDL 58 05/07/2019   Lab Results  Component Value Date   LDLCALC 60 05/07/2019   Lab Results  Component Value Date   TRIG 53 05/07/2019    Assessment & Plan:   Problem List Items Addressed This Visit      Endocrine  Hypothyroidism - Primary   Relevant Orders   TSH     1. Hypothyroidism, unspecified type Synthroid-recheck levels-increase dosage - TSH  2. Hyperlipidemia, unspecified hyperlipidemia type Lipitor-stable  3. Essential (primary) hypertension   Check bp at home -left arm repeat 152/98 -report bp at home and follow up with new primary care doctor-may need additional medication for bp control-goal 130/80 Follow-up: new primary care doctor -repeat HTN -bring bp readings to appt   Alazay Leicht Hannah Beat, MD

## 2020-02-19 DIAGNOSIS — E039 Hypothyroidism, unspecified: Secondary | ICD-10-CM | POA: Diagnosis not present

## 2020-02-20 LAB — TSH: TSH: 0.67 mIU/L (ref 0.40–4.50)

## 2020-03-07 DIAGNOSIS — E039 Hypothyroidism, unspecified: Secondary | ICD-10-CM | POA: Diagnosis not present

## 2020-03-07 DIAGNOSIS — E785 Hyperlipidemia, unspecified: Secondary | ICD-10-CM | POA: Diagnosis not present

## 2020-03-07 DIAGNOSIS — I10 Essential (primary) hypertension: Secondary | ICD-10-CM | POA: Diagnosis not present

## 2020-05-07 DIAGNOSIS — E785 Hyperlipidemia, unspecified: Secondary | ICD-10-CM | POA: Diagnosis not present

## 2020-05-07 DIAGNOSIS — K219 Gastro-esophageal reflux disease without esophagitis: Secondary | ICD-10-CM | POA: Diagnosis not present

## 2020-05-26 ENCOUNTER — Ambulatory Visit: Payer: Medicare Other | Admitting: Family Medicine

## 2020-06-07 DIAGNOSIS — K219 Gastro-esophageal reflux disease without esophagitis: Secondary | ICD-10-CM | POA: Diagnosis not present

## 2020-06-07 DIAGNOSIS — E039 Hypothyroidism, unspecified: Secondary | ICD-10-CM | POA: Diagnosis not present

## 2020-07-08 DIAGNOSIS — K219 Gastro-esophageal reflux disease without esophagitis: Secondary | ICD-10-CM | POA: Diagnosis not present

## 2020-07-08 DIAGNOSIS — E039 Hypothyroidism, unspecified: Secondary | ICD-10-CM | POA: Diagnosis not present

## 2020-07-11 DIAGNOSIS — Z23 Encounter for immunization: Secondary | ICD-10-CM | POA: Diagnosis not present

## 2020-08-07 DIAGNOSIS — E039 Hypothyroidism, unspecified: Secondary | ICD-10-CM | POA: Diagnosis not present

## 2020-08-07 DIAGNOSIS — I10 Essential (primary) hypertension: Secondary | ICD-10-CM | POA: Diagnosis not present

## 2020-08-26 DIAGNOSIS — Z23 Encounter for immunization: Secondary | ICD-10-CM | POA: Diagnosis not present

## 2020-09-09 DIAGNOSIS — Z1389 Encounter for screening for other disorder: Secondary | ICD-10-CM | POA: Diagnosis not present

## 2020-09-09 DIAGNOSIS — E785 Hyperlipidemia, unspecified: Secondary | ICD-10-CM | POA: Diagnosis not present

## 2020-09-09 DIAGNOSIS — E039 Hypothyroidism, unspecified: Secondary | ICD-10-CM | POA: Diagnosis not present

## 2020-09-09 DIAGNOSIS — I10 Essential (primary) hypertension: Secondary | ICD-10-CM | POA: Diagnosis not present

## 2020-09-09 DIAGNOSIS — Z1331 Encounter for screening for depression: Secondary | ICD-10-CM | POA: Diagnosis not present

## 2020-09-10 DIAGNOSIS — Z0001 Encounter for general adult medical examination with abnormal findings: Secondary | ICD-10-CM | POA: Diagnosis not present

## 2020-09-10 DIAGNOSIS — I10 Essential (primary) hypertension: Secondary | ICD-10-CM | POA: Diagnosis not present

## 2020-09-10 DIAGNOSIS — Z79899 Other long term (current) drug therapy: Secondary | ICD-10-CM | POA: Diagnosis not present

## 2020-10-09 DIAGNOSIS — E785 Hyperlipidemia, unspecified: Secondary | ICD-10-CM | POA: Diagnosis not present

## 2020-10-09 DIAGNOSIS — G459 Transient cerebral ischemic attack, unspecified: Secondary | ICD-10-CM | POA: Diagnosis not present

## 2020-11-09 DIAGNOSIS — E785 Hyperlipidemia, unspecified: Secondary | ICD-10-CM | POA: Diagnosis not present

## 2020-11-09 DIAGNOSIS — I10 Essential (primary) hypertension: Secondary | ICD-10-CM | POA: Diagnosis not present

## 2020-12-10 DIAGNOSIS — I10 Essential (primary) hypertension: Secondary | ICD-10-CM | POA: Diagnosis not present

## 2020-12-10 DIAGNOSIS — E785 Hyperlipidemia, unspecified: Secondary | ICD-10-CM | POA: Diagnosis not present

## 2021-01-07 DIAGNOSIS — I10 Essential (primary) hypertension: Secondary | ICD-10-CM | POA: Diagnosis not present

## 2021-01-07 DIAGNOSIS — E785 Hyperlipidemia, unspecified: Secondary | ICD-10-CM | POA: Diagnosis not present

## 2021-02-07 DIAGNOSIS — I10 Essential (primary) hypertension: Secondary | ICD-10-CM | POA: Diagnosis not present

## 2021-02-07 DIAGNOSIS — E785 Hyperlipidemia, unspecified: Secondary | ICD-10-CM | POA: Diagnosis not present

## 2021-02-19 DIAGNOSIS — K219 Gastro-esophageal reflux disease without esophagitis: Secondary | ICD-10-CM | POA: Diagnosis not present

## 2021-02-19 DIAGNOSIS — I10 Essential (primary) hypertension: Secondary | ICD-10-CM | POA: Diagnosis not present

## 2021-02-19 DIAGNOSIS — E039 Hypothyroidism, unspecified: Secondary | ICD-10-CM | POA: Diagnosis not present

## 2021-02-19 DIAGNOSIS — E785 Hyperlipidemia, unspecified: Secondary | ICD-10-CM | POA: Diagnosis not present

## 2021-02-23 DIAGNOSIS — Z79899 Other long term (current) drug therapy: Secondary | ICD-10-CM | POA: Diagnosis not present

## 2021-02-23 DIAGNOSIS — I10 Essential (primary) hypertension: Secondary | ICD-10-CM | POA: Diagnosis not present

## 2021-02-23 DIAGNOSIS — E039 Hypothyroidism, unspecified: Secondary | ICD-10-CM | POA: Diagnosis not present

## 2021-02-23 DIAGNOSIS — Z0001 Encounter for general adult medical examination with abnormal findings: Secondary | ICD-10-CM | POA: Diagnosis not present

## 2021-03-10 ENCOUNTER — Other Ambulatory Visit: Payer: Self-pay

## 2021-03-22 DIAGNOSIS — I10 Essential (primary) hypertension: Secondary | ICD-10-CM | POA: Diagnosis not present

## 2021-03-22 DIAGNOSIS — K219 Gastro-esophageal reflux disease without esophagitis: Secondary | ICD-10-CM | POA: Diagnosis not present

## 2021-04-21 DIAGNOSIS — E039 Hypothyroidism, unspecified: Secondary | ICD-10-CM | POA: Diagnosis not present

## 2021-04-21 DIAGNOSIS — I10 Essential (primary) hypertension: Secondary | ICD-10-CM | POA: Diagnosis not present

## 2021-04-30 ENCOUNTER — Ambulatory Visit (INDEPENDENT_AMBULATORY_CARE_PROVIDER_SITE_OTHER): Payer: Medicare Other | Admitting: Urology

## 2021-04-30 ENCOUNTER — Encounter: Payer: Self-pay | Admitting: Urology

## 2021-04-30 ENCOUNTER — Other Ambulatory Visit: Payer: Self-pay

## 2021-04-30 VITALS — BP 157/106 | HR 51 | Ht 70.0 in | Wt 161.0 lb

## 2021-04-30 DIAGNOSIS — R339 Retention of urine, unspecified: Secondary | ICD-10-CM | POA: Diagnosis not present

## 2021-04-30 DIAGNOSIS — N390 Urinary tract infection, site not specified: Secondary | ICD-10-CM

## 2021-04-30 DIAGNOSIS — R3915 Urgency of urination: Secondary | ICD-10-CM | POA: Diagnosis not present

## 2021-04-30 DIAGNOSIS — R972 Elevated prostate specific antigen [PSA]: Secondary | ICD-10-CM | POA: Diagnosis not present

## 2021-04-30 DIAGNOSIS — R351 Nocturia: Secondary | ICD-10-CM

## 2021-04-30 DIAGNOSIS — N401 Enlarged prostate with lower urinary tract symptoms: Secondary | ICD-10-CM

## 2021-04-30 DIAGNOSIS — N138 Other obstructive and reflux uropathy: Secondary | ICD-10-CM

## 2021-04-30 DIAGNOSIS — R3912 Poor urinary stream: Secondary | ICD-10-CM

## 2021-04-30 LAB — MICROSCOPIC EXAMINATION: Renal Epithel, UA: NONE SEEN /hpf

## 2021-04-30 LAB — URINALYSIS, ROUTINE W REFLEX MICROSCOPIC
Bilirubin, UA: NEGATIVE
Glucose, UA: NEGATIVE
Ketones, UA: NEGATIVE
Nitrite, UA: POSITIVE — AB
Protein,UA: NEGATIVE
Specific Gravity, UA: 1.02 (ref 1.005–1.030)
Urobilinogen, Ur: 0.2 mg/dL (ref 0.2–1.0)
pH, UA: 6 (ref 5.0–7.5)

## 2021-04-30 LAB — BLADDER SCAN: Scan Result: 152

## 2021-04-30 MED ORDER — TAMSULOSIN HCL 0.4 MG PO CAPS: 0.4000 mg | ORAL_CAPSULE | Freq: Every day | ORAL | 11 refills | Status: DC

## 2021-04-30 MED ORDER — SULFAMETHOXAZOLE-TRIMETHOPRIM 800-160 MG PO TABS: 1.0000 | ORAL_TABLET | Freq: Two times a day (BID) | ORAL | 1 refills | Status: DC

## 2021-04-30 NOTE — Progress Notes (Signed)
Subjective: 1. Elevated PSA   2. BPH with urinary obstruction   3. Urgency of urination   4. Weak urinary stream   5. Nocturia   6. Incomplete bladder emptying   7. Urinary tract infection without hematuria, site unspecified      Consult requested by Dr. Avon Gully    Hector Welch is a 75 yo male who is sent by Dr. Felecia Shelling for an elevated PSA of 4.38 in May.   His PSA in 7/20 was 3.3.   Hector Welch has moderate LUTS with urgency, nocturia 3-4x and frequency.  Hector Welch has a reduced stream but it is variable and Hector Welch doesn't always feel Hector Welch empties completely.  Hector Welch had some hematuria about 50 years ago and had a foley placed.  Hector Welch has done well since.  Hector Welch has had no UTI's, stones or other GU surgery.  Hector Welch has no dysuria, malodorous urine or hematuria.  ROS:  ROS  No Known Allergies  Past Medical History:  Diagnosis Date   Astigmatism    Cataracts, bilateral    Cervicalgia    Cervicalgia    Chest pain, unspecified    Essential (primary) hypertension    Gastro-esophageal reflux disease with esophagitis    GERD (gastroesophageal reflux disease)    Glaucoma    Hyperlipidemia, unspecified    Hypertension    Mini stroke (HCC) 12/2017   Nontoxic multinodular goiter    Osteoarthritis    Other obstructive and reflux uropathy    Pain in joint involving shoulder region    Pain in right ankle and joints of right foot    Thyroid goiter    Transient cerebral ischemic attack, unspecified    Unspecified glaucoma     Past Surgical History:  Procedure Laterality Date   APPENDECTOMY  APH, 60's   COLONOSCOPY N/A 12/05/2012   Procedure: COLONOSCOPY;  Surgeon: Corbin Ade, MD;  [facility];  Service: Endoscopy;  Laterality: N/A;  1:00 PM   DENTAL SURGERY     THYROIDECTOMY  07/17/2018   THYROIDECTOMY N/A 07/17/2018   Procedure: TOTAL THYROIDECTOMY;  Surgeon: Darnell Level, MD;  Location: [facility];  Service: General;  Laterality: N/A;    Social History   Socioeconomic History   Marital status:  Married    Spouse name: Not on file   Number of children: 4   Years of education: 12   Highest education level: Not on file  Occupational History   Occupation: Retired  Tobacco Use   Smoking status: Former    Types: Cigarettes    Quit date: 1999    Years since quitting: 23.5   Smokeless tobacco: Never   Tobacco comments:    was a light smoker  Building services engineer Use: Never used  Substance and Sexual Activity   Alcohol use: No   Drug use: No   Sexual activity: Not Currently  Other Topics Concern   Not on file  Social History Narrative   Lives w/ wife   Caffeine use: Coffee daily   Right handed    Social Determinants of Health   Financial Resource Strain: Not on file  Food Insecurity: Not on file  Transportation Needs: Not on file  Physical Activity: Not on file  Stress: Not on file  Social Connections: Not on file  Intimate Partner Violence: Not on file    Family History  Problem Relation Age of Onset   Cancer Mother    Cancer Father    Cancer Sister  Cancer Brother     Anti-infectives: Anti-infectives (From admission, onward)    Start     Dose/Rate Route Frequency Ordered Stop   04/30/21 0000  sulfamethoxazole-trimethoprim (BACTRIM DS) 800-160 MG tablet        1 tablet Oral Every 12 hours 04/30/21 1235         Current Outpatient Medications  Medication Sig Dispense Refill   aspirin EC 81 MG tablet Take 81 mg by mouth daily.     atorvastatin (LIPITOR) 20 MG tablet Take 20 mg by mouth daily.     levothyroxine (SYNTHROID) 88 MCG tablet Take 1 tablet (88 mcg total) by mouth daily. 90 tablet 3   nebivolol (BYSTOLIC) 10 MG tablet Take 10 mg by mouth at bedtime.      sulfamethoxazole-trimethoprim (BACTRIM DS) 800-160 MG tablet Take 1 tablet by mouth every 12 (twelve) hours. 28 tablet 1   tamsulosin (FLOMAX) 0.4 MG CAPS capsule Take 1 capsule (0.4 mg total) by mouth daily. 30 capsule 11   No current facility-administered medications for this visit.      Objective: Vital signs in last 24 hours: BP (!) 157/106   Pulse (!) 51   Ht 5\' 10"  (1.778 m)   Wt 161 lb (73 kg)   BMI 23.10 kg/m   Intake/Output from previous day: No intake/output data recorded. Intake/Output this shift: @IOTHISSHIFT @   Physical Exam Vitals reviewed.  Constitutional:      Appearance: Normal appearance.  Chest:  Breasts:    Right: No supraclavicular adenopathy.     Left: No supraclavicular adenopathy.  Abdominal:     General: Abdomen is flat.     Palpations: Abdomen is soft.     Hernia: No hernia is present.  Genitourinary:    Comments: Nl uncirc phallus with adequate meatus. Scrotum normal. Testes normal. 3cm RUP spermatocele. Normal left epididymis. AP without lesions. NST without mass. Prostate 2+ without nodules. SV non-palpable. Musculoskeletal:        General: No swelling or tenderness. Normal range of motion.     Cervical back: Normal range of motion and neck supple.  Lymphadenopathy:     Cervical: No cervical adenopathy.     Upper Body:     Right upper body: No supraclavicular adenopathy.     Left upper body: No supraclavicular adenopathy.     Lower Body: No right inguinal adenopathy. No left inguinal adenopathy.  Skin:    General: Skin is warm and dry.  Neurological:     General: No focal deficit present.     Mental Status: Hector Welch is alert and oriented to person, place, and time.    Lab Results:  Results for orders placed or performed in visit on 04/30/21 (from the past 24 hour(s))  Urinalysis, Routine w reflex microscopic     Status: Abnormal   Collection Time: 04/30/21 11:28 AM  Result Value Ref Range   Specific Gravity, UA 1.020 1.005 - 1.030   pH, UA 6.0 5.0 - 7.5   Color, UA Amber (A) Yellow   Appearance Ur Cloudy (A) Clear   Leukocytes,UA 1+ (A) Negative   Protein,UA Negative Negative/Trace   Glucose, UA Negative Negative   Ketones, UA Negative Negative   RBC, UA Trace (A) Negative   Bilirubin, UA Negative  Negative   Urobilinogen, Ur 0.2 0.2 - 1.0 mg/dL   Nitrite, UA Positive (A) Negative   Microscopic Examination See below:    Narrative   Performed at:  01 - [facility] [address]  [place], [place]  [id number] Lab Director: Chinita Pester [place], Phone:  [phone]  Microscopic Examination     Status: Abnormal   Collection Time: 04/30/21 11:28 AM   Urine  Result Value Ref Range   WBC, UA 11-30 (A) 0 - 5 /hpf   RBC 0-2 0 - 2 /hpf   Epithelial Cells (non renal) 0-10 0 - 10 /hpf   Renal Epithel, UA None seen None seen /hpf   Bacteria, UA Many (A) None seen/Few   Narrative   Performed at:  [address] - [facility] [address] [place], [place]  [id number] Lab Director: Chinita Pester [place], Phone:  [phone]    BMET No results for input(s): NA, K, CL, CO2, GLUCOSE, BUN, CREATININE, CALCIUM in the last 72 hours. PT/INR No results for input(s): LABPROT, INR in the last 72 hours. ABG No results for input(s): PHART, HCO3 in the last 72 hours.  Invalid input(s): PCO2, PO2  Studies/Results: No results found.   Assessment/Plan: Elevated PSA.  His PSA is up a point over the last year but his exam is benign and the level is not too high for his age.   Hector Welch appears to have a UTI which could impact the PSA level.  I will culture the urine and send a script for bactrim.  Hector Welch will return with a repeat PSA in about 2 months.  BPH with BOO.  Hector Welch has a slow stream and a slightly elevated PVR with moderate LUTS.   I will start him on tamsulosin and reviewed the side effects.    Meds ordered this encounter  Medications   tamsulosin (FLOMAX) 0.4 MG CAPS capsule    Sig: Take 1 capsule (0.4 mg total) by mouth daily.    Dispense:  30 capsule    Refill:  11   sulfamethoxazole-trimethoprim (BACTRIM DS) 800-160 MG tablet    Sig: Take 1 tablet by mouth every 12 (twelve) hours.    Dispense:  28 tablet    Refill:  1      Orders Placed This Encounter  Procedures   Microscopic  Examination   Urine Culture   Urinalysis, Routine w reflex microscopic   PSA, total and free    Standing Status:   Future    Standing Expiration Date:   08/31/2021   Bladder scan   PR COMPLEX UROFLOWMETRY     Return in about 2 months (around 07/01/2021) for 2-3 months with PSA..    CC: Avon Gully MD.     Hector Welch 04/30/2021 [phone]

## 2021-04-30 NOTE — Progress Notes (Signed)
Urological Symptom Review   Uroflow  Peak Flow: 63ml Average Flow: 24ml Voided Volume: 50ml Voiding Time: 17sec Flow Time: 17sec Time to Peak Flow: 1sec  PVR Volume:   Patient is experiencing the following symptoms: Frequent urination Hard to postpone urination Get up at night to urinate Weak stream   Review of Systems  Gastrointestinal (upper)  : Negative for upper GI symptoms  Gastrointestinal (lower) : Negative for lower GI symptoms  Constitutional : Weight loss  Skin: Negative for skin symptoms  Eyes: Negative for eye symptoms  Ear/Nose/Throat : Negative for Ear/Nose/Throat symptoms  Hematologic/Lymphatic: Negative for Hematologic/Lymphatic symptoms  Cardiovascular : Negative for cardiovascular symptoms  Respiratory : Negative for respiratory symptoms  Endocrine: Negative for endocrine symptoms  Musculoskeletal: Negative for musculoskeletal symptoms  Neurological: Negative for neurological symptoms  Psychologic: Negative for psychiatric symptoms

## 2021-05-03 LAB — URINE CULTURE

## 2021-05-22 DIAGNOSIS — R972 Elevated prostate specific antigen [PSA]: Secondary | ICD-10-CM | POA: Diagnosis not present

## 2021-05-22 DIAGNOSIS — I10 Essential (primary) hypertension: Secondary | ICD-10-CM | POA: Diagnosis not present

## 2021-06-22 DIAGNOSIS — E039 Hypothyroidism, unspecified: Secondary | ICD-10-CM | POA: Diagnosis not present

## 2021-06-22 DIAGNOSIS — I10 Essential (primary) hypertension: Secondary | ICD-10-CM | POA: Diagnosis not present

## 2021-07-02 ENCOUNTER — Other Ambulatory Visit: Payer: Self-pay

## 2021-07-02 ENCOUNTER — Other Ambulatory Visit: Payer: Medicare Other

## 2021-07-02 DIAGNOSIS — R972 Elevated prostate specific antigen [PSA]: Secondary | ICD-10-CM

## 2021-07-03 LAB — PSA, TOTAL AND FREE
PSA, Free Pct: 20.4 %
PSA, Free: 0.94 ng/mL
Prostate Specific Ag, Serum: 4.6 ng/mL — ABNORMAL HIGH (ref 0.0–4.0)

## 2021-07-09 ENCOUNTER — Encounter: Payer: Self-pay | Admitting: Urology

## 2021-07-09 ENCOUNTER — Other Ambulatory Visit: Payer: Self-pay

## 2021-07-09 ENCOUNTER — Ambulatory Visit (INDEPENDENT_AMBULATORY_CARE_PROVIDER_SITE_OTHER): Payer: Medicare Other | Admitting: Urology

## 2021-07-09 VITALS — BP 155/88 | HR 66 | Temp 98.8°F | Ht 70.0 in | Wt 164.6 lb

## 2021-07-09 DIAGNOSIS — N390 Urinary tract infection, site not specified: Secondary | ICD-10-CM | POA: Diagnosis not present

## 2021-07-09 DIAGNOSIS — R3915 Urgency of urination: Secondary | ICD-10-CM

## 2021-07-09 DIAGNOSIS — R972 Elevated prostate specific antigen [PSA]: Secondary | ICD-10-CM | POA: Diagnosis not present

## 2021-07-09 DIAGNOSIS — N401 Enlarged prostate with lower urinary tract symptoms: Secondary | ICD-10-CM

## 2021-07-09 DIAGNOSIS — N138 Other obstructive and reflux uropathy: Secondary | ICD-10-CM

## 2021-07-09 LAB — URINALYSIS, ROUTINE W REFLEX MICROSCOPIC
Bilirubin, UA: NEGATIVE
Glucose, UA: NEGATIVE
Ketones, UA: NEGATIVE
Leukocytes,UA: NEGATIVE
Nitrite, UA: NEGATIVE
Protein,UA: NEGATIVE
RBC, UA: NEGATIVE
Specific Gravity, UA: 1.01 (ref 1.005–1.030)
Urobilinogen, Ur: 1 mg/dL (ref 0.2–1.0)
pH, UA: 6.5 (ref 5.0–7.5)

## 2021-07-09 NOTE — Progress Notes (Signed)
Subjective: 1. Elevated PSA   2. BPH with urinary obstruction   3. Urgency of urination   4. Urinary tract infection without hematuria, site unspecified        [name] is a 75 yo male who is sent by Dr. Felecia Shelling for an elevated PSA of 4.38 in May.   His PSA is 4.6 with a 20.4% f/t ratio on repeat prior to this visit and in 7/20 it was 3.3.   He has moderate LUTS with urgency, nocturia 2x and frequency.  He had e. Coli on his culture in July and completed the bactrim.  His LUTS are improved.  He has no dysuria or hematuira.  His IPSS is 10-11 ROS:  ROS  No Known Allergies  Past Medical History:  Diagnosis Date   Astigmatism    Cataracts, bilateral    Cervicalgia    Cervicalgia    Chest pain, unspecified    Essential (primary) hypertension    Gastro-esophageal reflux disease with esophagitis    GERD (gastroesophageal reflux disease)    Glaucoma    Hyperlipidemia, unspecified    Hypertension    Mini stroke (HCC) 12/2017   Nontoxic multinodular goiter    Osteoarthritis    Other obstructive and reflux uropathy    Pain in joint involving shoulder region    Pain in right ankle and joints of right foot    Thyroid goiter    Transient cerebral ischemic attack, unspecified    Unspecified glaucoma     Past Surgical History:  Procedure Laterality Date   APPENDECTOMY  APH, 60's   COLONOSCOPY N/A 12/05/2012   Procedure: COLONOSCOPY;  Surgeon: Corbin Ade, MD;  [facility];  Service: Endoscopy;  Laterality: N/A;  1:00 PM   DENTAL SURGERY     THYROIDECTOMY  07/17/2018   THYROIDECTOMY N/A 07/17/2018   Procedure: TOTAL THYROIDECTOMY;  Surgeon: Darnell Level, MD;  Location: [facility];  Service: General;  Laterality: N/A;    Social History   Socioeconomic History   Marital status: Married    Spouse name: Not on file   Number of children: 4   Years of education: 12   Highest education level: Not on file  Occupational History   Occupation: Retired  Tobacco Use    Smoking status: Former    Types: Cigarettes    Quit date: 1999    Years since quitting: 23.7   Smokeless tobacco: Never   Tobacco comments:    was a light smoker  Building services engineer Use: Never used  Substance and Sexual Activity   Alcohol use: No   Drug use: No   Sexual activity: Not Currently  Other Topics Concern   Not on file  Social History Narrative   Lives w/ wife   Caffeine use: Coffee daily   Right handed    Social Determinants of Health   Financial Resource Strain: Not on file  Food Insecurity: Not on file  Transportation Needs: Not on file  Physical Activity: Not on file  Stress: Not on file  Social Connections: Not on file  Intimate Partner Violence: Not on file    Family History  Problem Relation Age of Onset   Cancer Mother    Cancer Father    Cancer Sister    Cancer Brother     Anti-infectives: Anti-infectives (From admission, onward)    None       Current Outpatient Medications  Medication Sig Dispense Refill   aspirin EC 81 MG  tablet Take 81 mg by mouth daily.     atorvastatin (LIPITOR) 20 MG tablet Take 20 mg by mouth daily.     levothyroxine (SYNTHROID) 88 MCG tablet Take 1 tablet (88 mcg total) by mouth daily. 90 tablet 3   nebivolol (BYSTOLIC) 10 MG tablet Take 10 mg by mouth at bedtime.      tamsulosin (FLOMAX) 0.4 MG CAPS capsule Take 1 capsule (0.4 mg total) by mouth daily. 30 capsule 11   No current facility-administered medications for this visit.     Objective: Vital signs in last 24 hours: BP (!) 155/88 (BP Location: Left Arm, Patient Position: Sitting, Cuff Size: Normal)   Pulse 66   Temp 98.8 F (37.1 C)   Ht 5\' 10"  (1.778 m)   Wt 164 lb 9.6 oz (74.7 kg)   BMI 23.62 kg/m   Intake/Output from previous day: No intake/output data recorded. Intake/Output this shift: @IOTHISSHIFT @   Physical Exam  Lab Results:  Results for orders placed or performed in visit on 07/09/21 (from the past 24 hour(s))  Urinalysis,  Routine w reflex microscopic     Status: None   Collection Time: 07/09/21  4:35 PM  Result Value Ref Range   Specific Gravity, UA 1.010 1.005 - 1.030   pH, UA 6.5 5.0 - 7.5   Color, UA Yellow Yellow   Appearance Ur Clear Clear   Leukocytes,UA Negative Negative   Protein,UA Negative Negative/Trace   Glucose, UA Negative Negative   Ketones, UA Negative Negative   RBC, UA Negative Negative   Bilirubin, UA Negative Negative   Urobilinogen, Ur 1.0 0.2 - 1.0 mg/dL   Nitrite, UA Negative Negative   Microscopic Examination Comment    Narrative   Performed at:  [address] [facility] [address] [place], [address]  [place] Lab Director: [place], Phone:  [phone]     BMET No results for input(s): NA, K, CL, CO2, GLUCOSE, BUN, CREATININE, CALCIUM in the last 72 hours. PT/INR No results for input(s): LABPROT, INR in the last 72 hours. ABG No results for input(s): PHART, HCO3 in the last 72 hours.  Invalid input(s): PCO2, PO2  Studies/Results: No results found.   Assessment/Plan: Elevated PSA.  His PSA was up a point over the last year but his exam was benign and the level is not too high for his age.  His repeat PSA is 4.6 which is a slight further increase but he did have the UTI in July.  I will have him return for another PSA in 3 months.     BPH with BOO.  He is voiding better since the antibiotic and will stay on tamsulosin.    No orders of the defined types were placed in this encounter.     Orders Placed This Encounter  Procedures   PSA, total and free    Standing Status:   Future    Standing Expiration Date:   01/06/2022   Urinalysis, Routine w reflex microscopic      Return in about 3 months (around 10/08/2021) for with PSA.    CC: 01/08/2022 MD.     Hector Welch 07/09/2021 Bjorn Pippin Patient ID: 07/11/2021., male   DOB: 1946/02/05, 74 y.o.   MRN: [id number]

## 2021-07-09 NOTE — Progress Notes (Signed)

## 2021-07-23 DIAGNOSIS — R972 Elevated prostate specific antigen [PSA]: Secondary | ICD-10-CM | POA: Diagnosis not present

## 2021-07-23 DIAGNOSIS — I10 Essential (primary) hypertension: Secondary | ICD-10-CM | POA: Diagnosis not present

## 2021-08-18 DIAGNOSIS — E039 Hypothyroidism, unspecified: Secondary | ICD-10-CM | POA: Diagnosis not present

## 2021-08-18 DIAGNOSIS — R972 Elevated prostate specific antigen [PSA]: Secondary | ICD-10-CM | POA: Diagnosis not present

## 2021-08-18 DIAGNOSIS — E785 Hyperlipidemia, unspecified: Secondary | ICD-10-CM | POA: Diagnosis not present

## 2021-08-18 DIAGNOSIS — I10 Essential (primary) hypertension: Secondary | ICD-10-CM | POA: Diagnosis not present

## 2021-08-25 DIAGNOSIS — Z1389 Encounter for screening for other disorder: Secondary | ICD-10-CM | POA: Diagnosis not present

## 2021-08-25 DIAGNOSIS — Z23 Encounter for immunization: Secondary | ICD-10-CM | POA: Diagnosis not present

## 2021-08-25 DIAGNOSIS — Z1331 Encounter for screening for depression: Secondary | ICD-10-CM | POA: Diagnosis not present

## 2021-08-25 DIAGNOSIS — K219 Gastro-esophageal reflux disease without esophagitis: Secondary | ICD-10-CM | POA: Diagnosis not present

## 2021-08-25 DIAGNOSIS — E039 Hypothyroidism, unspecified: Secondary | ICD-10-CM | POA: Diagnosis not present

## 2021-08-25 DIAGNOSIS — I10 Essential (primary) hypertension: Secondary | ICD-10-CM | POA: Diagnosis not present

## 2021-08-25 DIAGNOSIS — H409 Unspecified glaucoma: Secondary | ICD-10-CM | POA: Diagnosis not present

## 2021-09-24 DIAGNOSIS — I10 Essential (primary) hypertension: Secondary | ICD-10-CM | POA: Diagnosis not present

## 2021-09-24 DIAGNOSIS — E049 Nontoxic goiter, unspecified: Secondary | ICD-10-CM | POA: Diagnosis not present

## 2021-10-01 ENCOUNTER — Other Ambulatory Visit: Payer: Medicare Other

## 2021-10-01 ENCOUNTER — Other Ambulatory Visit: Payer: Self-pay

## 2021-10-01 DIAGNOSIS — R972 Elevated prostate specific antigen [PSA]: Secondary | ICD-10-CM

## 2021-10-02 LAB — PSA, TOTAL AND FREE
PSA, Free Pct: 11.6 %
PSA, Free: 0.88 ng/mL
Prostate Specific Ag, Serum: 7.6 ng/mL — ABNORMAL HIGH (ref 0.0–4.0)

## 2021-10-08 ENCOUNTER — Other Ambulatory Visit: Payer: Self-pay

## 2021-10-08 ENCOUNTER — Ambulatory Visit (INDEPENDENT_AMBULATORY_CARE_PROVIDER_SITE_OTHER): Payer: Medicare Other | Admitting: Urology

## 2021-10-08 ENCOUNTER — Encounter: Payer: Self-pay | Admitting: Urology

## 2021-10-08 VITALS — BP 140/86 | HR 76

## 2021-10-08 DIAGNOSIS — R351 Nocturia: Secondary | ICD-10-CM | POA: Diagnosis not present

## 2021-10-08 DIAGNOSIS — R972 Elevated prostate specific antigen [PSA]: Secondary | ICD-10-CM

## 2021-10-08 DIAGNOSIS — N138 Other obstructive and reflux uropathy: Secondary | ICD-10-CM

## 2021-10-08 DIAGNOSIS — N390 Urinary tract infection, site not specified: Secondary | ICD-10-CM

## 2021-10-08 DIAGNOSIS — R3912 Poor urinary stream: Secondary | ICD-10-CM | POA: Diagnosis not present

## 2021-10-08 DIAGNOSIS — R3915 Urgency of urination: Secondary | ICD-10-CM

## 2021-10-08 DIAGNOSIS — N401 Enlarged prostate with lower urinary tract symptoms: Secondary | ICD-10-CM

## 2021-10-08 DIAGNOSIS — R600 Localized edema: Secondary | ICD-10-CM

## 2021-10-08 LAB — URINALYSIS, ROUTINE W REFLEX MICROSCOPIC
Bilirubin, UA: NEGATIVE
Glucose, UA: NEGATIVE
Ketones, UA: NEGATIVE
Nitrite, UA: NEGATIVE
Protein,UA: NEGATIVE
RBC, UA: NEGATIVE
Specific Gravity, UA: 1.01 (ref 1.005–1.030)
Urobilinogen, Ur: 2 mg/dL — ABNORMAL HIGH (ref 0.2–1.0)
pH, UA: 6.5 (ref 5.0–7.5)

## 2021-10-08 LAB — MICROSCOPIC EXAMINATION
RBC, Urine: NONE SEEN /hpf (ref 0–2)
Renal Epithel, UA: NONE SEEN /hpf
WBC, UA: >30 /hpf — AB (ref 0–5)

## 2021-10-08 NOTE — Progress Notes (Signed)
Subjective: 1. Urinary tract infection without hematuria, site unspecified   2. Elevated PSA   3. BPH with urinary obstruction   4. Urgency of urination   5. Weak urinary stream   6. Nocturia   7. Bilateral lower extremity edema     10/08/21: Mr. Spaziano returns today in f/u.   His PSA is up to 7.6 prior to this visit.  He has a reduced flow recent and has increased nocturia with small voids.  He has no dysuria or hematuria.  He has some malodorous urine and he had a low grade fever a few days ago.  He had an e. Coli UTI in June.  He has a refill on the bactrim but hasn't started it yet.  He has some tightness in the right calf without tenderness.  He remains on tamsulosin.  His UA has 2+ LE.  His prior PVR was with a PF of 59ml/sec on a flowrate in 7/22.      07/09/21: Mr. Brosky is a 75 yo male who is sent by Dr. Felecia Shelling for an elevated PSA of 4.38 in May.   His PSA is 4.6 with a 20.4% f/t ratio on repeat prior to this visit and in 7/20 it was 3.3.   He has moderate LUTS with urgency, nocturia 2x and frequency.  He had e. Coli on his culture in July and completed the bactrim.  His LUTS are improved.  He has no dysuria or hematuira.  His IPSS is 10-11 ROS:  ROS  No Known Allergies  Past Medical History:  Diagnosis Date   Astigmatism    Cataracts, bilateral    Cervicalgia    Cervicalgia    Chest pain, unspecified    Essential (primary) hypertension    Gastro-esophageal reflux disease with esophagitis    GERD (gastroesophageal reflux disease)    Glaucoma    Hyperlipidemia, unspecified    Hypertension    Mini stroke 12/2017   Nontoxic multinodular goiter    Osteoarthritis    Other obstructive and reflux uropathy    Pain in joint involving shoulder region    Pain in right ankle and joints of right foot    Thyroid goiter    Transient cerebral ischemic attack, unspecified    Unspecified glaucoma     Past Surgical History:  Procedure Laterality Date   APPENDECTOMY  APH,  60's   COLONOSCOPY N/A 12/05/2012   Procedure: COLONOSCOPY;  Surgeon: Corbin Ade, MD;  [facility];  Service: Endoscopy;  Laterality: N/A;  1:00 PM   DENTAL SURGERY     THYROIDECTOMY  07/17/2018   THYROIDECTOMY N/A 07/17/2018   Procedure: TOTAL THYROIDECTOMY;  Surgeon: Darnell Level, MD;  Location: [facility];  Service: General;  Laterality: N/A;    Social History   Socioeconomic History   Marital status: Married    Spouse name: Not on file   Number of children: 4   Years of education: 12   Highest education level: Not on file  Occupational History   Occupation: Retired  Tobacco Use   Smoking status: Former    Types: Cigarettes    Quit date: 1999    Years since quitting: 23.9   Smokeless tobacco: Never   Tobacco comments:    was a light smoker  Vaping Use   Vaping Use: Never used  Substance and Sexual Activity   Alcohol use: No   Drug use: No   Sexual activity: Not Currently  Other Topics Concern  Not on file  Social History Narrative   Lives w/ wife   Caffeine use: Coffee daily   Right handed    Social Determinants of Health   Financial Resource Strain: Not on file  Food Insecurity: Not on file  Transportation Needs: Not on file  Physical Activity: Not on file  Stress: Not on file  Social Connections: Not on file  Intimate Partner Violence: Not on file    Family History  Problem Relation Age of Onset   Cancer Mother    Cancer Father    Cancer Sister    Cancer Brother     Anti-infectives: Anti-infectives (From admission, onward)    None       Current Outpatient Medications  Medication Sig Dispense Refill   aspirin EC 81 MG tablet Take 81 mg by mouth daily.     atorvastatin (LIPITOR) 20 MG tablet Take 20 mg by mouth daily.     levothyroxine (SYNTHROID) 88 MCG tablet Take 1 tablet (88 mcg total) by mouth daily. 90 tablet 3   nebivolol (BYSTOLIC) 10 MG tablet Take 10 mg by mouth at bedtime.      tamsulosin (FLOMAX) 0.4 MG CAPS capsule  Take 1 capsule (0.4 mg total) by mouth daily. 30 capsule 11   No current facility-administered medications for this visit.     Objective: Vital signs in last 24 hours: BP 140/86    Pulse 76   Intake/Output from previous day: No intake/output data recorded. Intake/Output this shift: @IOTHISSHIFT @   Physical Exam  Lab Results:  Results for orders placed or performed in visit on 10/08/21 (from the past 24 hour(s))  Urinalysis, Routine w reflex microscopic     Status: Abnormal   Collection Time: 10/08/21  3:33 PM  Result Value Ref Range   Specific Gravity, UA 1.010 1.005 - 1.030   pH, UA 6.5 5.0 - 7.5   Color, UA Yellow Yellow   Appearance Ur Clear Clear   Leukocytes,UA 2+ (A) Negative   Protein,UA Negative Negative/Trace   Glucose, UA Negative Negative   Ketones, UA Negative Negative   RBC, UA Negative Negative   Bilirubin, UA Negative Negative   Urobilinogen, Ur 2.0 (H) 0.2 - 1.0 mg/dL   Nitrite, UA Negative Negative   Microscopic Examination See below:    Narrative   Performed at:  [address] - [facility] [address], [place], [place]  [place] Lab Director: [id number] [place], Phone:  [phone]  Microscopic Examination     Status: Abnormal   Collection Time: 10/08/21  3:33 PM   Urine  Result Value Ref Range   WBC, UA >30 (A) 0 - 5 /hpf   RBC None seen 0 - 2 /hpf   Epithelial Cells (non renal) 0-10 0 - 10 /hpf   Renal Epithel, UA None seen None seen /hpf   Bacteria, UA Few None seen/Few   Narrative   Performed at:  [address] - [facility] [address] [place], [place]  [place] Lab Director: [id number] [place], Phone:  [phone]      BMET No results for input(s): NA, K, CL, CO2, GLUCOSE, BUN, CREATININE, CALCIUM in the last 72 hours. PT/INR No results for input(s): LABPROT, INR in the last 72 hours. ABG No results for input(s): PHART, HCO3 in the last 72 hours.  Invalid input(s): PCO2, PO2  Studies/Results: No results  found.   Assessment/Plan: Elevated PSA.  His PSA is up significantly but he appears to have a recurrent UTI.  I will culture the urine and treat for an extended period.  I will have him return for another PSA in 3 months.     BPH with BOO.  He has worsening LUTS with the possible UTI  and will stay on tamsulosin.   I will have him return for cystoscopy.   Asymmetric lower extremity edema, right > left.   I will send for venous dopplers.   ED.  He has some concerns about ED and we will discuss that further at f/u.   No orders of the defined types were placed in this encounter.      Orders Placed This Encounter  Procedures   Urine Culture   Microscopic Examination   PSA, total and free    Standing Status:   Future    Standing Expiration Date:   04/08/2022   Urinalysis, Routine w reflex microscopic      Return in about 4 weeks (around 11/05/2021) for for cystoscopy. .    CC: Rosita Fire MD.     Zaul Camera 10/08/2021 [id number] Patient ID: Luciana Axe., male   DOB: [birth year], 75 y.o.   MRN: [id number] Patient ID: Jalani Chillis., male   DOB: 1945-12-20, 75 y.o.   MRN: [id number]

## 2021-10-08 NOTE — Progress Notes (Signed)
Urological Symptom Review  Patient is experiencing the following symptoms: Frequent urination Hard to postpone urination Get up at night to urinate Leakage of urine Stream starts and stops Trouble starting stream Weak stream   Review of Systems  Gastrointestinal (upper)  : Negative for upper GI symptoms  Gastrointestinal (lower) : Negative for lower GI symptoms  Constitutional : Negative for symptoms  Skin: Negative for skin symptoms  Eyes: Negative for eye symptoms  Ear/Nose/Throat : Negative for Ear/Nose/Throat symptoms  Hematologic/Lymphatic: Negative for Hematologic/Lymphatic symptoms  Cardiovascular : Leg swelling  Respiratory : Negative for respiratory symptoms  Endocrine: Negative for endocrine symptoms  Musculoskeletal: Negative for musculoskeletal symptoms  Neurological: Negative for neurological symptoms  Psychologic: Negative for psychiatric symptoms

## 2021-10-10 LAB — URINE CULTURE

## 2021-10-20 DIAGNOSIS — Z23 Encounter for immunization: Secondary | ICD-10-CM | POA: Diagnosis not present

## 2021-10-25 DIAGNOSIS — E039 Hypothyroidism, unspecified: Secondary | ICD-10-CM | POA: Diagnosis not present

## 2021-10-25 DIAGNOSIS — I10 Essential (primary) hypertension: Secondary | ICD-10-CM | POA: Diagnosis not present

## 2021-10-26 ENCOUNTER — Telehealth: Payer: Self-pay

## 2021-10-26 NOTE — Telephone Encounter (Signed)
Patient needing to know if he needs to contiue taking this medication Sulfamethoxazoletrimethori  If so, he is out.  Call back  [phone]  Thanks, Rosey Bath

## 2021-10-27 NOTE — Telephone Encounter (Signed)
Please advise-  Attempted to call patient back- mailbox full and unable to receive any new messages.

## 2021-10-28 NOTE — Telephone Encounter (Signed)
Patient called and lab appt scheduled with patient.  Voiced understanding of no need for additional antibiotics.

## 2021-11-23 ENCOUNTER — Other Ambulatory Visit: Payer: Medicare Other

## 2021-11-23 ENCOUNTER — Other Ambulatory Visit: Payer: Self-pay

## 2021-11-23 DIAGNOSIS — N390 Urinary tract infection, site not specified: Secondary | ICD-10-CM

## 2021-11-23 LAB — URINALYSIS, ROUTINE W REFLEX MICROSCOPIC
Bilirubin, UA: NEGATIVE
Glucose, UA: NEGATIVE
Ketones, UA: NEGATIVE
Leukocytes,UA: NEGATIVE
Nitrite, UA: NEGATIVE
Protein,UA: NEGATIVE
RBC, UA: NEGATIVE
Specific Gravity, UA: 1.015 (ref 1.005–1.030)
Urobilinogen, Ur: 0.2 mg/dL (ref 0.2–1.0)
pH, UA: 6 (ref 5.0–7.5)

## 2021-11-24 ENCOUNTER — Other Ambulatory Visit: Payer: Medicare Other

## 2021-11-25 DIAGNOSIS — I10 Essential (primary) hypertension: Secondary | ICD-10-CM | POA: Diagnosis not present

## 2021-11-25 DIAGNOSIS — E039 Hypothyroidism, unspecified: Secondary | ICD-10-CM | POA: Diagnosis not present

## 2021-11-26 ENCOUNTER — Encounter: Payer: Self-pay | Admitting: Urology

## 2021-11-26 ENCOUNTER — Ambulatory Visit (INDEPENDENT_AMBULATORY_CARE_PROVIDER_SITE_OTHER): Payer: Medicare Other | Admitting: Urology

## 2021-11-26 ENCOUNTER — Other Ambulatory Visit: Payer: Self-pay

## 2021-11-26 VITALS — BP 172/95 | HR 65 | Wt 171.4 lb

## 2021-11-26 DIAGNOSIS — R3915 Urgency of urination: Secondary | ICD-10-CM | POA: Diagnosis not present

## 2021-11-26 DIAGNOSIS — N138 Other obstructive and reflux uropathy: Secondary | ICD-10-CM | POA: Diagnosis not present

## 2021-11-26 DIAGNOSIS — R3912 Poor urinary stream: Secondary | ICD-10-CM | POA: Diagnosis not present

## 2021-11-26 DIAGNOSIS — R972 Elevated prostate specific antigen [PSA]: Secondary | ICD-10-CM

## 2021-11-26 DIAGNOSIS — R339 Retention of urine, unspecified: Secondary | ICD-10-CM

## 2021-11-26 DIAGNOSIS — N401 Enlarged prostate with lower urinary tract symptoms: Secondary | ICD-10-CM | POA: Diagnosis not present

## 2021-11-26 LAB — URINALYSIS, ROUTINE W REFLEX MICROSCOPIC
Bilirubin, UA: NEGATIVE
Glucose, UA: NEGATIVE
Ketones, UA: NEGATIVE
Leukocytes,UA: NEGATIVE
Nitrite, UA: NEGATIVE
Protein,UA: NEGATIVE
RBC, UA: NEGATIVE
Specific Gravity, UA: <1.005 — ABNORMAL LOW (ref 1.005–1.030)
Urobilinogen, Ur: 0.2 mg/dL (ref 0.2–1.0)
pH, UA: 6 (ref 5.0–7.5)

## 2021-11-26 LAB — BLADDER SCAN AMB NON-IMAGING: Scan Result: >292

## 2021-11-26 MED ORDER — FINASTERIDE 5 MG PO TABS: 5.0000 mg | ORAL_TABLET | Freq: Every day | ORAL | 3 refills | Status: DC

## 2021-11-26 NOTE — Progress Notes (Signed)
Subjective: 1. BPH with urinary obstruction   2. Weak urinary stream   3. Urgency of urination   4. Incomplete bladder emptying   5. Elevated PSA    11/26/21: Hector Welch returns today in f/u for cystoscopy for further evaluation of his LUTS and UTI history.  He has some frequency and urgency but can hold it better following treatment of the UTI.  He has  a reduced stream with some terminal dribbling.   He remains on tamsulosin 0.4mg  daily.   PVR today is >253ml.  PF on flowrate was 61ml/sec but that appeared to be an artifact.  The MF was 64ml/sec with 163ml voided.    10/08/21: Hector Welch returns today in f/u.   His PSA is up to 7.6 prior to this visit.  He has a reduced flow recent and has increased nocturia with small voids.  He has no dysuria or hematuria.  He has some malodorous urine and he had a low grade fever a few days ago.  He had an e. Coli UTI in June.  He has a refill on the bactrim but hasn't started it yet.  He has some tightness in the right calf without tenderness.  He remains on tamsulosin.  His UA has 2+ LE.  His prior PVR was 175ml with a PF of 12ml/sec on a flowrate in 7/22.      07/09/21: Hector Welch is a 76 yo male who is sent by Dr. Legrand Rams for an elevated PSA of 4.38 in May.   His PSA is 4.6 with a 20.4% f/t ratio on repeat prior to this visit and in 7/20 it was 3.3.   He has moderate LUTS with urgency, nocturia 2x and frequency.  He had e. Coli on his culture in July and completed the bactrim.  His LUTS are improved.  He has no dysuria or hematuira.  His IPSS is 10-11  ROS:  Review of Systems  Genitourinary:  Positive for frequency and urgency. Negative for dysuria.       Reduced stream with some dribble. Nocturia x 3.    All other systems reviewed and are negative.  No Known Allergies  Past Medical History:  Diagnosis Date   Astigmatism    Cataracts, bilateral    Cervicalgia    Cervicalgia    Chest pain, unspecified    Essential (primary) hypertension     Gastro-esophageal reflux disease with esophagitis    GERD (gastroesophageal reflux disease)    Glaucoma    Hyperlipidemia, unspecified    Hypertension    Mini stroke 12/2017   Nontoxic multinodular goiter    Osteoarthritis    Other obstructive and reflux uropathy    Pain in joint involving shoulder region    Pain in right ankle and joints of right foot    Thyroid goiter    Transient cerebral ischemic attack, unspecified    Unspecified glaucoma     Past Surgical History:  Procedure Laterality Date   APPENDECTOMY  APH, 60's   COLONOSCOPY N/A 12/05/2012   Procedure: COLONOSCOPY;  Surgeon: Daneil Dolin, MD;  Location: [facility];  Service: Endoscopy;  Laterality: N/A;  1:00 PM   DENTAL SURGERY     THYROIDECTOMY  07/17/2018   THYROIDECTOMY N/A 07/17/2018   Procedure: TOTAL THYROIDECTOMY;  Surgeon: Armandina Gemma, MD;  Location: [facility];  Service: General;  Laterality: N/A;    Social History   Socioeconomic History   Marital status: Married    Spouse name: Not on file  Number of children: 4   Years of education: 12   Highest education level: Not on file  Occupational History   Occupation: Retired  Tobacco Use   Smoking status: Former    Types: Cigarettes    Quit date: 1999    Years since quitting: 24.1   Smokeless tobacco: Never   Tobacco comments:    was a light smoker  Vaping Use   Vaping Use: Never used  Substance and Sexual Activity   Alcohol use: No   Drug use: No   Sexual activity: Not Currently  Other Topics Concern   Not on file  Social History Narrative   Lives w/ wife   Caffeine use: Coffee daily   Right handed    Social Determinants of Health   Financial Resource Strain: Not on file  Food Insecurity: Not on file  Transportation Needs: Not on file  Physical Activity: Not on file  Stress: Not on file  Social Connections: Not on file  Intimate Partner Violence: Not on file    Family History  Problem Relation Age of Onset   Cancer Mother     Cancer Father    Cancer Sister    Cancer Brother     Anti-infectives: Anti-infectives (From admission, onward)    None       Current Outpatient Medications  Medication Sig Dispense Refill   aspirin EC 81 MG tablet Take 81 mg by mouth daily.     atorvastatin (LIPITOR) 20 MG tablet Take 20 mg by mouth daily.     finasteride (PROSCAR) 5 MG tablet Take 1 tablet (5 mg total) by mouth daily. 90 tablet 3   levothyroxine (SYNTHROID) 88 MCG tablet Take 1 tablet (88 mcg total) by mouth daily. 90 tablet 3   nebivolol (BYSTOLIC) 10 MG tablet Take 10 mg by mouth at bedtime.      tamsulosin (FLOMAX) 0.4 MG CAPS capsule Take 1 capsule (0.4 mg total) by mouth daily. 30 capsule 11   No current facility-administered medications for this visit.     Objective: Vital signs in last 24 hours: BP (!) 172/95    Pulse 65    Wt 171 lb 6.4 oz (77.7 kg)    BMI 24.59 kg/m   Intake/Output from previous day: No intake/output data recorded. Intake/Output this shift: @IOTHISSHIFT @   Physical Exam  Lab Results:  Results for orders placed or performed in visit on 11/26/21 (from the past 24 hour(s))  Urinalysis, Routine w reflex microscopic     Status: Abnormal   Collection Time: 11/26/21 11:14 AM  Result Value Ref Range   Specific Gravity, UA <1.005 (L) 1.005 - 1.030   pH, UA 6.0 5.0 - 7.5   Color, UA Yellow Yellow   Appearance Ur Clear Clear   Leukocytes,UA Negative Negative   Protein,UA Negative Negative/Trace   Glucose, UA Negative Negative   Ketones, UA Negative Negative   RBC, UA Negative Negative   Bilirubin, UA Negative Negative   Urobilinogen, Ur 0.2 0.2 - 1.0 mg/dL   Nitrite, UA Negative Negative   Microscopic Examination Comment    Narrative   Performed at:  [facility] [address], [place], [place]  [id number] Lab Director: Mina Marble MT, Phone:  [phone]   UA is clear today.     BMET No results for input(s): NA, K, CL, CO2, GLUCOSE, BUN, CREATININE,  CALCIUM in the last 72 hours. PT/INR No results for input(s): LABPROT, INR in the last 72 hours. ABG No  results for input(s): PHART, HCO3 in the last 72 hours.  Invalid input(s): PCO2, PO2  Studies/Results: No results found. Procedure: Flexible cystoscopy.  He was prepped with betadine and 2% lidocaine jelly.   Cipro 500mg  po was given.  The scope was passed.  The anterior urethra is normal but there mild/moderate stricture disease of the bulbar urethra that didn't impede scope passage.   The prostate is about 3cm with trilobar hyperplasia with an obstructing middle lobe.  He has moderate to severe trabeculation with cellules and small diverticuli.  The UO's are unremarkable.     Assessment/Plan: Elevated PSA with recent UTI.   His UA is clear today.  PSA in 3 months to assess the response to UTI treatment and finasteride.   BPH with BOO.  He is voiding better following treatment of the UTI, but has a PVR of 265ml today and trilobar hyperplasia with obstruction on cystoscopy.   I discussed options including continuing tamsulosin monotherapy, adding finasteride, Rezum and TURP.   At this time he was like to try adding the finasteride.  Side effects and impact on PSA reviewed.     Urethral stricture.  Mild/moderate 3cm bulbar stricture.   ED.  We didn't address this today.   Meds ordered this encounter  Medications   finasteride (PROSCAR) 5 MG tablet    Sig: Take 1 tablet (5 mg total) by mouth daily.    Dispense:  90 tablet    Refill:  3       Orders Placed This Encounter  Procedures   Urinalysis, Routine w reflex microscopic   PSA, total and free    Standing Status:   Future    Standing Expiration Date:   05/26/2022   BLADDER SCAN AMB NON-IMAGING      Return in about 3 months (around 02/23/2022) for with PSA and PVR.    CC: Rosita Fire MD.     Jacqueline Holbrook 11/27/2021 [id number] Patient ID: Hector Axe., male   DOB: Aug 14, 1946, 76 y.o.   MRN: [id number] Patient  ID: Hector Hundley., male   DOB: 11/18/1945, 76 y.o.   MRN: [id number]

## 2021-11-26 NOTE — Progress Notes (Signed)
post void residual= >292 ml Uroflow  Peak Flow: 33ml Average Flow: 5 ml Voided Volume: 36 ml Voiding Time: 36 sec Flow Time: 32 sec Time to Peak Flow: 4 sec  PVR Volume:  >243ml

## 2021-12-03 ENCOUNTER — Other Ambulatory Visit: Payer: Medicare Other | Admitting: Urology

## 2021-12-23 DIAGNOSIS — I10 Essential (primary) hypertension: Secondary | ICD-10-CM | POA: Diagnosis not present

## 2021-12-23 DIAGNOSIS — E039 Hypothyroidism, unspecified: Secondary | ICD-10-CM | POA: Diagnosis not present

## 2021-12-28 ENCOUNTER — Emergency Department (HOSPITAL_COMMUNITY)
Admission: EM | Admit: 2021-12-28 | Discharge: 2021-12-28 | Disposition: A | Discharge status: Home / Self Care | Payer: Medicare Other | Attending: Emergency Medicine | Admitting: Emergency Medicine

## 2021-12-28 ENCOUNTER — Emergency Department (HOSPITAL_COMMUNITY): Payer: Medicare Other

## 2021-12-28 ENCOUNTER — Other Ambulatory Visit: Payer: Self-pay

## 2021-12-28 ENCOUNTER — Encounter (HOSPITAL_COMMUNITY): Payer: Self-pay

## 2021-12-28 DIAGNOSIS — Z79899 Other long term (current) drug therapy: Secondary | ICD-10-CM | POA: Diagnosis not present

## 2021-12-28 DIAGNOSIS — M160 Bilateral primary osteoarthritis of hip: Secondary | ICD-10-CM | POA: Insufficient documentation

## 2021-12-28 DIAGNOSIS — I10 Essential (primary) hypertension: Secondary | ICD-10-CM | POA: Diagnosis not present

## 2021-12-28 DIAGNOSIS — M25552 Pain in left hip: Secondary | ICD-10-CM

## 2021-12-28 MED ORDER — OXYCODONE-ACETAMINOPHEN 5-325 MG PO TABS
1.0000 | ORAL_TABLET | Freq: Once | ORAL | Status: AC
Start: 2021-12-28 — End: 2021-12-28
  Administered 2021-12-28: 1 via ORAL
  Filled 2021-12-28: qty 1

## 2021-12-28 MED ORDER — OXYCODONE-ACETAMINOPHEN 5-325 MG PO TABS: 1.0000 | ORAL_TABLET | Freq: Four times a day (QID) | ORAL | 0 refills | Status: DC | PRN

## 2021-12-28 MED ORDER — OXYCODONE-ACETAMINOPHEN 5-325 MG PO TABS
1.0000 | ORAL_TABLET | Freq: Once | ORAL | Status: AC
  Administered 2021-12-28: 1 via ORAL
  Filled 2021-12-28: qty 1

## 2021-12-28 NOTE — ED Notes (Signed)
Pt BP consistently reading high, last BP reading was 152/102. Pulse has been in the 50s-mid 50s. MD made aware  ?

## 2021-12-28 NOTE — Discharge Instructions (Addendum)
Follow-up with your orthopedic doctor or you can see Dr. Romeo Apple here in [place] ?

## 2021-12-28 NOTE — ED Provider Notes (Incomplete)
[facility] EMERGENCY DEPARTMENT Provider Note   CSN: [id number] Arrival date & time: 12/28/21  [id number]     History {Add pertinent medical, surgical, social history, OB history to HPI:1} Chief Complaint  Patient presents with   Hypertension    Hector Hailu. is a 76 y.o. male.  Patient with left hip pain.   Hypertension      Home Medications Prior to Admission medications   Medication Sig Start Date End Date Taking? Authorizing Provider  atenolol (TENORMIN) 50 MG tablet Take 50 mg by mouth daily. 12/14/21  Yes [provider]  atorvastatin (LIPITOR) 20 MG tablet Take 20 mg by mouth daily.   Yes [provider]  finasteride (PROSCAR) 5 MG tablet Take 1 tablet (5 mg total) by mouth daily. 11/26/21  Yes Irine Seal, MD  levothyroxine (SYNTHROID) 75 MCG tablet Take 75 mcg by mouth every morning. 12/06/21  Yes [provider]  losartan (COZAAR) 100 MG tablet Take 100 mg by mouth daily. 12/23/21  Yes [provider]  oxyCODONE-acetaminophen (PERCOCET) 5-325 MG tablet Take 1 tablet by mouth every 6 (six) hours as needed. 12/28/21  Yes Milton Ferguson, MD  tamsulosin (FLOMAX) 0.4 MG CAPS capsule Take 1 capsule (0.4 mg total) by mouth daily. 04/30/21  Yes Irine Seal, MD  nebivolol (BYSTOLIC) 10 MG tablet Take 10 mg by mouth at bedtime.  Patient not taking: Reported on 12/28/2021    [provider]      Allergies    Patient has no known allergies.    Review of Systems   Review of Systems  Physical Exam Updated Vital Signs BP (!) 153/93    Pulse (!) 46    Temp 98.1 F (36.7 C) (Oral)    Resp 19    Ht 5\' 10"  (1.778 m)    Wt 75.3 kg    SpO2 100%    BMI 23.82 kg/m  Physical Exam  ED Results / Procedures / Treatments   Labs (all labs ordered are listed, but only abnormal results are displayed) Labs Reviewed - No data to display  EKG None  Radiology DG Lumbar Spine Complete  Result Date: 12/28/2021 CLINICAL DATA:  Left hip pain  starting 5 days ago. EXAM: LUMBAR SPINE - COMPLETE 4+ VIEW COMPARISON:  11/29/2011 FINDINGS: There is decreased bone mineralization. There are 5 non-rib-bearing lumbar-type vertebral bodies. Minimal dextrocurvature centered at L4-5. 5 mm grade 1 anterolisthesis of L4 on L5 unchanged minimally worsened from 09/06/2012. Vertebral body heights are maintained. Mild L3-4 through L5-S1 disc space narrowing is mildly worsened from prior. Mild anterior superior L2 vertebral body endplate sclerosis likely degenerative change, increased from prior. IMPRESSION: Grade 1 anterolisthesis of L4 on L5 appears mildly worsened from 2013. Mildly worsened mild multilevel degenerative disc and endplate changes. Electronically Signed   By: Yvonne Kendall M.D.   On: 12/28/2021 11:01   DG Hip Unilat W or Wo Pelvis 2-3 Views Left  Result Date: 12/28/2021 CLINICAL DATA:  Left hip pain developed 5 days ago. EXAM: DG HIP (WITH OR WITHOUT PELVIS) 2-3V LEFT COMPARISON:  None. FINDINGS: Mildly decreased bone mineralization. Mild-to-moderate bilateral femoroacetabular joint space narrowing. Mild-to-moderate bilateral femoroacetabular femoral head-neck junction degenerative osteophytosis with prominence of the bilateral anterior superior femoral head-neck junctions increasing propensity for CAM-type femoroacetabular impingement. No acute fracture or dislocation. Mild L4-5 degenerative disc changes. Vascular calcifications are noted. IMPRESSION: Moderate bilateral femoral head-neck junction degenerative osteophytes and CAM-type bump deformities which increase propensity for CAM-type femoroacetabular impingement. Overall moderate bilateral  hip osteoarthritis. Electronically Signed   By: Yvonne Kendall M.D.   On: 12/28/2021 10:58    Procedures Procedures  {Document cardiac monitor, telemetry assessment procedure when appropriate:1}  Medications Ordered in ED Medications  oxyCODONE-acetaminophen (PERCOCET/ROXICET) 5-325 MG per tablet 1 tablet  (1 tablet Oral Given 12/28/21 1024)  oxyCODONE-acetaminophen (PERCOCET/ROXICET) 5-325 MG per tablet 1 tablet (1 tablet Oral Given 12/28/21 1439)    ED Course/ Medical Decision Making/ A&P                           Medical Decision Making Amount and/or Complexity of Data Reviewed Radiology: ordered.  Risk Prescription drug management.   Patient with severe arthritis left hip.  He is given pain medicine and referred to Ortho  {Document critical care time when appropriate:1} {Document review of labs and clinical decision tools ie heart score, Chads2Vasc2 etc:1}  {Document your independent review of radiology images, and any outside records:1} {Document your discussion with family members, caretakers, and with consultants:1} {Document social determinants of health affecting pt's care:1} {Document your decision making why or why not admission, treatments were needed:1} Final Clinical Impression(s) / ED Diagnoses Final diagnoses:  Left hip pain    Rx / DC Orders ED Discharge Orders          Ordered    oxyCODONE-acetaminophen (PERCOCET) 5-325 MG tablet  Every 6 hours PRN        12/28/21 1457

## 2021-12-28 NOTE — ED Triage Notes (Signed)
Patient states that he developed left hip pain five days ago that has now progressed to numbness from hip down leg for the past three days. States that he has been having HTN with recent adjustment to meds but it has not improved.  ?

## 2021-12-29 DIAGNOSIS — I10 Essential (primary) hypertension: Secondary | ICD-10-CM | POA: Diagnosis not present

## 2021-12-29 DIAGNOSIS — M5451 Vertebrogenic low back pain: Secondary | ICD-10-CM | POA: Diagnosis not present

## 2021-12-30 DIAGNOSIS — M1612 Unilateral primary osteoarthritis, left hip: Secondary | ICD-10-CM | POA: Diagnosis not present

## 2021-12-30 DIAGNOSIS — M47816 Spondylosis without myelopathy or radiculopathy, lumbar region: Secondary | ICD-10-CM | POA: Diagnosis not present

## 2022-01-23 DIAGNOSIS — E039 Hypothyroidism, unspecified: Secondary | ICD-10-CM | POA: Diagnosis not present

## 2022-01-23 DIAGNOSIS — I10 Essential (primary) hypertension: Secondary | ICD-10-CM | POA: Diagnosis not present

## 2022-01-27 DIAGNOSIS — M47816 Spondylosis without myelopathy or radiculopathy, lumbar region: Secondary | ICD-10-CM | POA: Diagnosis not present

## 2022-02-17 DIAGNOSIS — Z0001 Encounter for general adult medical examination with abnormal findings: Secondary | ICD-10-CM | POA: Diagnosis not present

## 2022-02-17 DIAGNOSIS — R972 Elevated prostate specific antigen [PSA]: Secondary | ICD-10-CM | POA: Diagnosis not present

## 2022-02-17 DIAGNOSIS — I10 Essential (primary) hypertension: Secondary | ICD-10-CM | POA: Diagnosis not present

## 2022-02-17 DIAGNOSIS — E039 Hypothyroidism, unspecified: Secondary | ICD-10-CM | POA: Diagnosis not present

## 2022-02-17 DIAGNOSIS — E755 Other lipid storage disorders: Secondary | ICD-10-CM | POA: Diagnosis not present

## 2022-02-23 DIAGNOSIS — I10 Essential (primary) hypertension: Secondary | ICD-10-CM | POA: Diagnosis not present

## 2022-02-23 DIAGNOSIS — E785 Hyperlipidemia, unspecified: Secondary | ICD-10-CM | POA: Diagnosis not present

## 2022-02-23 DIAGNOSIS — E039 Hypothyroidism, unspecified: Secondary | ICD-10-CM | POA: Diagnosis not present

## 2022-02-23 DIAGNOSIS — K219 Gastro-esophageal reflux disease without esophagitis: Secondary | ICD-10-CM | POA: Diagnosis not present

## 2022-02-24 DIAGNOSIS — M47816 Spondylosis without myelopathy or radiculopathy, lumbar region: Secondary | ICD-10-CM | POA: Diagnosis not present

## 2022-02-25 ENCOUNTER — Other Ambulatory Visit: Payer: Medicare Other

## 2022-02-25 DIAGNOSIS — R972 Elevated prostate specific antigen [PSA]: Secondary | ICD-10-CM | POA: Diagnosis not present

## 2022-02-26 LAB — PSA, TOTAL AND FREE
PSA, Free Pct: 22.3 %
PSA, Free: 0.87 ng/mL
Prostate Specific Ag, Serum: 3.9 ng/mL (ref 0.0–4.0)

## 2022-03-04 ENCOUNTER — Ambulatory Visit (INDEPENDENT_AMBULATORY_CARE_PROVIDER_SITE_OTHER): Payer: Medicare Other | Admitting: Urology

## 2022-03-04 ENCOUNTER — Encounter: Payer: Self-pay | Admitting: Urology

## 2022-03-04 VITALS — BP 142/90 | HR 63 | Ht 70.0 in | Wt 160.0 lb

## 2022-03-04 DIAGNOSIS — R3912 Poor urinary stream: Secondary | ICD-10-CM | POA: Diagnosis not present

## 2022-03-04 DIAGNOSIS — N35912 Unspecified bulbous urethral stricture, male: Secondary | ICD-10-CM

## 2022-03-04 DIAGNOSIS — N138 Other obstructive and reflux uropathy: Secondary | ICD-10-CM

## 2022-03-04 DIAGNOSIS — R3915 Urgency of urination: Secondary | ICD-10-CM

## 2022-03-04 DIAGNOSIS — R339 Retention of urine, unspecified: Secondary | ICD-10-CM | POA: Diagnosis not present

## 2022-03-04 DIAGNOSIS — N390 Urinary tract infection, site not specified: Secondary | ICD-10-CM

## 2022-03-04 DIAGNOSIS — R972 Elevated prostate specific antigen [PSA]: Secondary | ICD-10-CM | POA: Diagnosis not present

## 2022-03-04 DIAGNOSIS — N401 Enlarged prostate with lower urinary tract symptoms: Secondary | ICD-10-CM | POA: Diagnosis not present

## 2022-03-04 LAB — URINALYSIS, ROUTINE W REFLEX MICROSCOPIC
Bilirubin, UA: NEGATIVE
Glucose, UA: NEGATIVE
Ketones, UA: NEGATIVE
Nitrite, UA: POSITIVE — AB
Protein,UA: NEGATIVE
Specific Gravity, UA: 1.02 (ref 1.005–1.030)
Urobilinogen, Ur: 1 mg/dL (ref 0.2–1.0)
pH, UA: 6.5 (ref 5.0–7.5)

## 2022-03-04 LAB — MICROSCOPIC EXAMINATION: Renal Epithel, UA: NONE SEEN /hpf

## 2022-03-04 LAB — BLADDER SCAN AMB NON-IMAGING: Scan Result: 125

## 2022-03-04 NOTE — Progress Notes (Signed)
post void residual = 125 ml  

## 2022-03-04 NOTE — Progress Notes (Signed)
Subjective: 1. BPH with urinary obstruction   2. Weak urinary stream   3. Urgency of urination   4. Incomplete bladder emptying   5. Elevated PSA   6. Urinary tract infection without hematuria, site unspecified    03/04/22: Hector Welch returns today in f/u. He remains on tamsulosin and finasteride and is doing well with an IPSS of 12.  He has nocturia x 2.  His PVR is 163ml.  His PSA is down from 7.6 to 3.9 on finasteride.   He has had a prior UTI but no symptoms suggestive of a recurrence but his UA has 6-10 WBC and many bacteria.    11/26/21: Hector Welch returns today in f/u for cystoscopy for further evaluation of his LUTS and UTI history.  He has some frequency and urgency but can hold it better following treatment of the UTI.  He has  a reduced stream with some terminal dribbling.   He remains on tamsulosin 0.4mg  daily.   PVR today is >210ml.  PF on flowrate was 73ml/sec but that appeared to be an artifact.  The MF was 70ml/sec with 112ml voided.    10/08/21: Hector Welch returns today in f/u.   His PSA is up to 7.6 prior to this visit.  He has a reduced flow recent and has increased nocturia with small voids.  He has no dysuria or hematuria.  He has some malodorous urine and he had a low grade fever a few days ago.  He had an e. Coli UTI in June.  He has a refill on the bactrim but hasn't started it yet.  He has some tightness in the right calf without tenderness.  He remains on tamsulosin.  His UA has 2+ LE.  His prior PVR was 14ml with a PF of 56ml/sec on a flowrate in 7/22.      07/09/21: Hector Welch is a 76 yo male who is sent by Dr. Legrand Rams for an elevated PSA of 4.38 in May.   His PSA is 4.6 with a 20.4% f/t ratio on repeat prior to this visit and in 7/20 it was 3.3.   He has moderate LUTS with urgency, nocturia 2x and frequency.  He had e. Coli on his culture in July and completed the bactrim.  His LUTS are improved.  He has no dysuria or hematuira.  His IPSS is 10-11  ROS:  Review of Systems  All  other systems reviewed and are negative.  No Known Allergies  Past Medical History:  Diagnosis Date   Astigmatism    Cataracts, bilateral    Cervicalgia    Cervicalgia    Chest pain, unspecified    Essential (primary) hypertension    Gastro-esophageal reflux disease with esophagitis    GERD (gastroesophageal reflux disease)    Glaucoma    Hyperlipidemia, unspecified    Hypertension    Mini stroke 12/2017   Nontoxic multinodular goiter    Osteoarthritis    Other obstructive and reflux uropathy    Pain in joint involving shoulder region    Pain in right ankle and joints of right foot    Thyroid goiter    Transient cerebral ischemic attack, unspecified    Unspecified glaucoma     Past Surgical History:  Procedure Laterality Date   APPENDECTOMY  APH, 60's   COLONOSCOPY N/A 12/05/2012   Procedure: COLONOSCOPY;  Surgeon: Daneil Dolin, MD;  Location: [facility];  Service: Endoscopy;  Laterality: N/A;  1:00 PM   DENTAL SURGERY  THYROIDECTOMY  07/17/2018   THYROIDECTOMY N/A 07/17/2018   Procedure: TOTAL THYROIDECTOMY;  Surgeon: Armandina Gemma, MD;  Location: [facility] OR;  Service: General;  Laterality: N/A;    Social History   Socioeconomic History   Marital status: Married    Spouse name: Not on file   Number of children: 4   Years of education: 12   Highest education level: Not on file  Occupational History   Occupation: Retired  Tobacco Use   Smoking status: Former    Types: Cigarettes    Quit date: 1999    Years since quitting: 24.3   Smokeless tobacco: Never   Tobacco comments:    was a light smoker  Scientific laboratory technician Use: Never used  Substance and Sexual Activity   Alcohol use: No   Drug use: No   Sexual activity: Not Currently  Other Topics Concern   Not on file  Social History Narrative   Lives w/ wife   Caffeine use: Coffee daily   Right handed    Social Determinants of Health   Financial Resource Strain: Not on file  Food Insecurity: Not on  file  Transportation Needs: Not on file  Physical Activity: Not on file  Stress: Not on file  Social Connections: Not on file  Intimate Partner Violence: Not on file    Family History  Problem Relation Age of Onset   Cancer Mother    Cancer Father    Cancer Sister    Cancer Brother     Anti-infectives: Anti-infectives (From admission, onward)    None       Current Outpatient Medications  Medication Sig Dispense Refill   atenolol (TENORMIN) 50 MG tablet Take 50 mg by mouth daily.     atorvastatin (LIPITOR) 20 MG tablet Take 20 mg by mouth daily.     finasteride (PROSCAR) 5 MG tablet Take 1 tablet (5 mg total) by mouth daily. 90 tablet 3   levothyroxine (SYNTHROID) 75 MCG tablet Take 75 mcg by mouth every morning.     losartan (COZAAR) 100 MG tablet Take 100 mg by mouth daily.     nebivolol (BYSTOLIC) 10 MG tablet Take 10 mg by mouth at bedtime.     oxyCODONE-acetaminophen (PERCOCET) 5-325 MG tablet Take 1 tablet by mouth every 6 (six) hours as needed. 20 tablet 0   tamsulosin (FLOMAX) 0.4 MG CAPS capsule Take 1 capsule (0.4 mg total) by mouth daily. 30 capsule 11   No current facility-administered medications for this visit.     Objective: Vital signs in last 24 hours: BP (!) 142/90   Pulse 63   Ht 5\' 10"  (1.778 m)   Wt 160 lb (72.6 kg)   BMI 22.96 kg/m   Intake/Output from previous day: No intake/output data recorded. Intake/Output this shift: @IOTHISSHIFT @   Physical Exam  Lab Results:  No results found for this or any previous visit (from the past 24 hour(s)).  UA has 6-10 WBC and many bacteria.     BMET No results for input(s): NA, K, CL, CO2, GLUCOSE, BUN, CREATININE, CALCIUM in the last 72 hours. PT/INR No results for input(s): LABPROT, INR in the last 72 hours. ABG No results for input(s): PHART, HCO3 in the last 72 hours.  Invalid input(s): PCO2, PO2 UA has >30 WBC with many bacteria.  Studies/Results: No results  found.   Assessment/Plan: Elevated PSA with recent UTI.   His PSA has fallen to 3.9 from 7.6 but corrected it is still  elevated.  His UA does look infected again today.  I will repeat the PSA in 3 month and have him continue the finasteride.  I have sent a urine culture and if positive, I will treat and put him on suppressive therapy.   BPH with BOO.  He is voiding better with the addition of finasteride to the tamsulosin and will continue that.  HIs PVR is down to 171ml from 236ml.     Urethral stricture.  Mild/moderate 3cm bulbar stricture was found on cystoscopy.   ED.  We didn't address this today.   No orders of the defined types were placed in this encounter.      Orders Placed This Encounter  Procedures   Urine Culture   Urinalysis, Routine w reflex microscopic   PSA, total and free    Standing Status:   Future    Standing Expiration Date:   09/04/2022   BLADDER SCAN AMB NON-IMAGING      Return in about 3 months (around 06/04/2022) for with PVR and PSA. .    CC: Rosita Fire MD.     Hector Welch 03/04/2022 [id number] Patient ID: Luciana Axe., male   DOB: [birth year], 76 y.o.   MRN: [id number] Patient ID: Lenox Quist., male   DOB: 03/10/1946, 76 y.o.   MRN: [id number] Patient ID: Gen Wolfinger., male   DOB: 1946/07/21, 76 y.o.   MRN: [id number]

## 2022-03-08 ENCOUNTER — Telehealth: Payer: Self-pay

## 2022-03-08 LAB — URINE CULTURE

## 2022-03-08 NOTE — Telephone Encounter (Signed)
-----   Message from Bjorn Pippin, MD sent at 03/08/2022 11:55 AM EDT ----- He needs ampicillin 500mg  po qid #30 with 5 refills and will need to take 1 po qhs with the refills and should stay on it until he sees me in f/u in August.   Make sure he knows to keep taking it after the initial course.  Thanks.  ----- Message ----- From: September, LPN Sent: Gustavus Messing   9:09 AM EDT To: 2/59/5638, MD  Please review No tx started

## 2022-03-09 MED ORDER — AMPICILLIN 500 MG PO CAPS: 500.0000 mg | ORAL_CAPSULE | Freq: Four times a day (QID) | ORAL | 5 refills | Status: DC

## 2022-03-09 NOTE — Telephone Encounter (Signed)
Patient called and made aware. Patient voiced understanding on instructions.

## 2022-03-26 DIAGNOSIS — I10 Essential (primary) hypertension: Secondary | ICD-10-CM | POA: Diagnosis not present

## 2022-03-26 DIAGNOSIS — E785 Hyperlipidemia, unspecified: Secondary | ICD-10-CM | POA: Diagnosis not present

## 2022-03-29 DIAGNOSIS — E039 Hypothyroidism, unspecified: Secondary | ICD-10-CM | POA: Diagnosis not present

## 2022-04-25 DIAGNOSIS — I10 Essential (primary) hypertension: Secondary | ICD-10-CM | POA: Diagnosis not present

## 2022-04-25 DIAGNOSIS — E785 Hyperlipidemia, unspecified: Secondary | ICD-10-CM | POA: Diagnosis not present

## 2022-05-26 DIAGNOSIS — I10 Essential (primary) hypertension: Secondary | ICD-10-CM | POA: Diagnosis not present

## 2022-05-26 DIAGNOSIS — E039 Hypothyroidism, unspecified: Secondary | ICD-10-CM | POA: Diagnosis not present

## 2022-05-27 ENCOUNTER — Other Ambulatory Visit: Payer: Medicare Other

## 2022-06-03 ENCOUNTER — Ambulatory Visit: Payer: Medicare Other | Admitting: Urology

## 2022-06-03 ENCOUNTER — Other Ambulatory Visit: Payer: Medicare Other

## 2022-06-03 DIAGNOSIS — R972 Elevated prostate specific antigen [PSA]: Secondary | ICD-10-CM | POA: Diagnosis not present

## 2022-06-04 LAB — PSA, TOTAL AND FREE
PSA, Free Pct: 17.7 %
PSA, Free: 0.85 ng/mL
Prostate Specific Ag, Serum: 4.8 ng/mL — ABNORMAL HIGH (ref 0.0–4.0)

## 2022-06-10 ENCOUNTER — Encounter: Payer: Self-pay | Admitting: Urology

## 2022-06-10 ENCOUNTER — Telehealth: Payer: Self-pay

## 2022-06-10 ENCOUNTER — Ambulatory Visit (INDEPENDENT_AMBULATORY_CARE_PROVIDER_SITE_OTHER): Payer: Medicare Other | Admitting: Urology

## 2022-06-10 VITALS — BP 174/95 | HR 57

## 2022-06-10 DIAGNOSIS — N138 Other obstructive and reflux uropathy: Secondary | ICD-10-CM

## 2022-06-10 DIAGNOSIS — R972 Elevated prostate specific antigen [PSA]: Secondary | ICD-10-CM | POA: Diagnosis not present

## 2022-06-10 DIAGNOSIS — N390 Urinary tract infection, site not specified: Secondary | ICD-10-CM | POA: Diagnosis not present

## 2022-06-10 DIAGNOSIS — R339 Retention of urine, unspecified: Secondary | ICD-10-CM | POA: Diagnosis not present

## 2022-06-10 DIAGNOSIS — N401 Enlarged prostate with lower urinary tract symptoms: Secondary | ICD-10-CM

## 2022-06-10 LAB — URINALYSIS, ROUTINE W REFLEX MICROSCOPIC
Bilirubin, UA: NEGATIVE
Glucose, UA: NEGATIVE
Ketones, UA: NEGATIVE
Nitrite, UA: POSITIVE — AB
Protein,UA: NEGATIVE
Specific Gravity, UA: 1.02 (ref 1.005–1.030)
Urobilinogen, Ur: 2 mg/dL — ABNORMAL HIGH (ref 0.2–1.0)
pH, UA: 6 (ref 5.0–7.5)

## 2022-06-10 LAB — MICROSCOPIC EXAMINATION
Renal Epithel, UA: NONE SEEN /hpf
WBC, UA: >30 /hpf — AB (ref 0–5)

## 2022-06-10 LAB — BLADDER SCAN AMB NON-IMAGING: Scan Result: 139

## 2022-06-10 MED ORDER — TAMSULOSIN HCL 0.4 MG PO CAPS: 0.4000 mg | ORAL_CAPSULE | Freq: Every day | ORAL | 3 refills | Status: DC

## 2022-06-10 NOTE — Telephone Encounter (Signed)
Patient called advising that they needed a prescription called into pharmacy.   Medication: finasteride (PROSCAR) 5 MG tablet  Pharmacy: [id number] - [place], [place] - [phone]

## 2022-06-10 NOTE — Progress Notes (Signed)
post void residual=139 

## 2022-06-10 NOTE — Progress Notes (Signed)
Subjective: 1. BPH with urinary obstruction   2. Incomplete bladder emptying   3. Elevated PSA   4. Urinary tract infection without hematuria, site unspecified    06/10/22: Hector Welch returns today in f/u.  He remains on tamsulosin and finasteride.  HIs IPSS is 7 and his PVR is 145ml.  He has no dysuria or hematuria.  His UA has >30 WBC and bacteria.   He has no associated signs or symptoms.  His PSA is back up to 4.8 with a f/t ratio of 17.7 from 3.9 despite the finasteride but his urine looks infected again.    03/04/22: Hector Welch returns today in f/u. He remains on tamsulosin and finasteride and is doing well with an IPSS of 12.  He has nocturia x 2.  His PVR is 187ml.  His PSA is down from 7.6 to 3.9 on finasteride.   He has had a prior UTI but no symptoms suggestive of a recurrence but his UA has 6-10 WBC and many bacteria.    11/26/21: Hector Welch returns today in f/u for cystoscopy for further evaluation of his LUTS and UTI history.  He has some frequency and urgency but can hold it better following treatment of the UTI.  He has  a reduced stream with some terminal dribbling.   He remains on tamsulosin 0.4mg  daily.   PVR today is >215ml.  PF on flowrate was 91ml/sec but that appeared to be an artifact.  The MF was 61ml/sec with 19ml voided.    10/08/21: Hector Welch returns today in f/u.   His PSA is up to 7.6 prior to this visit.  He has a reduced flow recent and has increased nocturia with small voids.  He has no dysuria or hematuria.  He has some malodorous urine and he had a low grade fever a few days ago.  He had an e. Coli UTI in June.  He has a refill on the bactrim but hasn't started it yet.  He has some tightness in the right calf without tenderness.  He remains on tamsulosin.  His UA has 2+ LE.  His prior PVR was 178ml with a PF of 102ml/sec on a flowrate in 7/22.      07/09/21: Hector Welch is a 76 yo male who is sent by Dr. Legrand Rams for an elevated PSA of 4.38 in May.   His PSA is 4.6 with a 20.4% f/t ratio  on repeat prior to this visit and in 7/20 it was 3.3.   He has moderate LUTS with urgency, nocturia 2x and frequency.  He had e. Coli on his culture in July and completed the bactrim.  His LUTS are improved.  He has no dysuria or hematuira.  His IPSS is 10-11   IPSS     Row Name 06/10/22 1100         International Prostate Symptom Score   How often have you had the sensation of not emptying your bladder? Less than half the time     How often have you had to urinate less than every two hours? Less than 1 in 5 times     How often have you found you stopped and started again several times when you urinated? Not at All     How often have you found it difficult to postpone urination? Less than 1 in 5 times     How often have you had a weak urinary stream? Less than half the time     How often have  you had to strain to start urination? Not at All     How many times did you typically get up at night to urinate? 1 Time     Total IPSS Score 7       Quality of Life due to urinary symptoms   If you were to spend the rest of your life with your urinary condition just the way it is now how would you feel about that? Mostly Satisfied               ROS:  Review of Systems  All other systems reviewed and are negative.   No Known Allergies  Past Medical History:  Diagnosis Date   Astigmatism    Cataracts, bilateral    Cervicalgia    Cervicalgia    Chest pain, unspecified    Essential (primary) hypertension    Gastro-esophageal reflux disease with esophagitis    GERD (gastroesophageal reflux disease)    Glaucoma    Hyperlipidemia, unspecified    Hypertension    Mini stroke 12/2017   Nontoxic multinodular goiter    Osteoarthritis    Other obstructive and reflux uropathy    Pain in joint involving shoulder region    Pain in right ankle and joints of right foot    Thyroid goiter    Transient cerebral ischemic attack, unspecified    Unspecified glaucoma     Past Surgical  History:  Procedure Laterality Date   APPENDECTOMY  APH, 60's   COLONOSCOPY N/A 12/05/2012   Procedure: COLONOSCOPY;  Surgeon: Corbin Ade, MD;  Location: [facility];  Service: Endoscopy;  Laterality: N/A;  1:00 PM   DENTAL SURGERY     THYROIDECTOMY  07/17/2018   THYROIDECTOMY N/A 07/17/2018   Procedure: TOTAL THYROIDECTOMY;  Surgeon: Darnell Level, MD;  Location: [facility];  Service: General;  Laterality: N/A;    Social History   Socioeconomic History   Marital status: Married    Spouse name: Not on file   Number of children: 4   Years of education: 12   Highest education level: Not on file  Occupational History   Occupation: Retired  Tobacco Use   Smoking status: Former    Types: Cigarettes    Quit date: 1999    Years since quitting: 24.6   Smokeless tobacco: Never   Tobacco comments:    was a light smoker  Building services engineer Use: Never used  Substance and Sexual Activity   Alcohol use: No   Drug use: No   Sexual activity: Not Currently  Other Topics Concern   Not on file  Social History Narrative   Lives w/ wife   Caffeine use: Coffee daily   Right handed    Social Determinants of Health   Financial Resource Strain: Not on file  Food Insecurity: Not on file  Transportation Needs: Not on file  Physical Activity: Not on file  Stress: Not on file  Social Connections: Not on file  Intimate Partner Violence: Not on file    Family History  Problem Relation Age of Onset   Cancer Mother    Cancer Father    Cancer Sister    Cancer Brother     Anti-infectives: Anti-infectives (From admission, onward)    None       Current Outpatient Medications  Medication Sig Dispense Refill   atenolol (TENORMIN) 50 MG tablet Take 50 mg by mouth daily.     atorvastatin (LIPITOR) 20 MG tablet Take 20  mg by mouth daily.     finasteride (PROSCAR) 5 MG tablet Take 1 tablet (5 mg total) by mouth daily. 90 tablet 3   levothyroxine (SYNTHROID) 75 MCG tablet Take 75 mcg by  mouth every morning.     losartan (COZAAR) 100 MG tablet Take 100 mg by mouth daily.     nebivolol (BYSTOLIC) 10 MG tablet Take 10 mg by mouth at bedtime.     tamsulosin (FLOMAX) 0.4 MG CAPS capsule Take 1 capsule (0.4 mg total) by mouth daily. 90 each 3   No current facility-administered medications for this visit.     Objective: Vital signs in last 24 hours: BP (!) 174/95   Pulse (!) 57   Intake/Output from previous day: No intake/output data recorded. Intake/Output this shift: @IOTHISSHIFT @   Physical Exam  Lab Results:  Results for orders placed or performed in visit on 06/10/22 (from the past 24 hour(s))  Urinalysis, Routine w reflex microscopic     Status: Abnormal   Collection Time: 06/10/22 11:10 AM  Result Value Ref Range   Specific Gravity, UA 1.020 1.005 - 1.030   pH, UA 6.0 5.0 - 7.5   Color, UA Yellow Yellow   Appearance Ur Hazy (A) Clear   Leukocytes,UA 2+ (A) Negative   Protein,UA Negative Negative/Trace   Glucose, UA Negative Negative   Ketones, UA Negative Negative   RBC, UA Trace (A) Negative   Bilirubin, UA Negative Negative   Urobilinogen, Ur 2.0 (H) 0.2 - 1.0 mg/dL   Nitrite, UA Positive (A) Negative   Microscopic Examination See below:    Narrative   Performed at:  [address] - [facility] [address] [place], [place]  [place] Lab Director: [id number], Phone:  [phone]  Microscopic Examination     Status: Abnormal   Collection Time: 06/10/22 11:10 AM   Urine  Result Value Ref Range   WBC, UA >30 (A) 0 - 5 /hpf   RBC, Urine 0-2 0 - 2 /hpf   Epithelial Cells (non renal) 0-10 0 - 10 /hpf   Renal Epithel, UA None seen None seen /hpf   Bacteria, UA Many (A) None seen/Few   Narrative   Performed at:  [address] - [facility] [address], [place], [place]  [place] Lab Director: [id number], Phone:  [phone]    UA has >30 WBC and many bacteria.     BMET No results for input(s): "NA", "K", "CL", "CO2",  "GLUCOSE", "BUN", "CREATININE", "CALCIUM" in the last 72 hours. PT/INR No results for input(s): "LABPROT", "INR" in the last 72 hours. ABG No results for input(s): "PHART", "HCO3" in the last 72 hours.  Invalid input(s): "PCO2", "PO2" UA has >30 WBC with many bacteria.  Studies/Results: No results found.   Assessment/Plan: Elevated PSA with recent UTI.   His PSA is back up to 4.8 after falling to 3.9 from 7.6 but his UA does look infected again today.  I will repeat the PSA in 3 month and have him continue the finasteride.  I have sent a urine culture and if positive, I will treat and put him on suppressive therapy.   BPH with BOO.  He is voiding better with the addition of finasteride to the tamsulosin and will continue that.  HIs PVR is stable at [id number].     Urethral stricture.  Mild/moderate 3cm bulbar stricture was found on cystoscopy.  He has a good stream.  ED.  We didn't address this today.   Meds  ordered this encounter  Medications   tamsulosin (FLOMAX) 0.4 MG CAPS capsule    Sig: Take 1 capsule (0.4 mg total) by mouth daily.    Dispense:  90 each    Refill:  3       Orders Placed This Encounter  Procedures   Urine Culture   Microscopic Examination   Urinalysis, Routine w reflex microscopic   PSA, total and free    Standing Status:   Future    Standing Expiration Date:   12/11/2022   BLADDER SCAN AMB NON-IMAGING      Return in about 3 months (around 09/10/2022) for with PSA..    CC: Rosita Fire MD.     Hector Welch 06/11/2022 [id number] Patient ID: Hector Axe., male   DOB: 12/08/1945, 76 y.o.   MRN: [id number]

## 2022-06-10 NOTE — Telephone Encounter (Signed)
Patient aware refills at pharmacy.

## 2022-06-14 ENCOUNTER — Telehealth: Payer: Self-pay

## 2022-06-14 LAB — URINE CULTURE

## 2022-06-14 MED ORDER — CEPHALEXIN 250 MG PO CAPS: 250.0000 mg | ORAL_CAPSULE | Freq: Three times a day (TID) | ORAL | 0 refills | Status: DC

## 2022-06-14 MED ORDER — CEPHALEXIN 250 MG PO CAPS: 250.0000 mg | ORAL_CAPSULE | Freq: Every evening | ORAL | 2 refills | Status: DC

## 2022-06-14 NOTE — Telephone Encounter (Signed)
Patient called and notified. Rx's sent to pharmacy and dosing scheduled reviewed with patient. Voiced understanding.  4-6 week UA check up scheduled with patient.

## 2022-06-14 NOTE — Telephone Encounter (Signed)
-----   Message from Bjorn Pippin, MD sent at 06/14/2022  4:50 PM EDT ----- He needs keflex 250mg  po tid #21 and then will need keflex 250mg  po qhs #30 with 5 refills.  He will need a repeat UA in 4-6 weeks.   Could see .  ----- Message ----- From: , RN Sent: 06/14/2022   3:11 PM EDT To: Ferdinand Lango, MD  Please review. No treatment started

## 2022-06-26 DIAGNOSIS — I10 Essential (primary) hypertension: Secondary | ICD-10-CM | POA: Diagnosis not present

## 2022-06-26 DIAGNOSIS — N400 Enlarged prostate without lower urinary tract symptoms: Secondary | ICD-10-CM | POA: Diagnosis not present

## 2022-07-19 ENCOUNTER — Ambulatory Visit (INDEPENDENT_AMBULATORY_CARE_PROVIDER_SITE_OTHER): Payer: Medicare Other | Admitting: Physician Assistant

## 2022-07-19 VITALS — BP 137/85 | HR 62

## 2022-07-19 DIAGNOSIS — R339 Retention of urine, unspecified: Secondary | ICD-10-CM

## 2022-07-19 DIAGNOSIS — N3080 Other cystitis without hematuria: Secondary | ICD-10-CM | POA: Diagnosis not present

## 2022-07-19 DIAGNOSIS — Z87440 Personal history of urinary (tract) infections: Secondary | ICD-10-CM

## 2022-07-19 DIAGNOSIS — N138 Other obstructive and reflux uropathy: Secondary | ICD-10-CM

## 2022-07-19 DIAGNOSIS — N401 Enlarged prostate with lower urinary tract symptoms: Secondary | ICD-10-CM

## 2022-07-19 DIAGNOSIS — R972 Elevated prostate specific antigen [PSA]: Secondary | ICD-10-CM | POA: Diagnosis not present

## 2022-07-19 DIAGNOSIS — N3000 Acute cystitis without hematuria: Secondary | ICD-10-CM

## 2022-07-19 DIAGNOSIS — N343 Urethral syndrome, unspecified: Secondary | ICD-10-CM | POA: Diagnosis not present

## 2022-07-19 DIAGNOSIS — R3916 Straining to void: Secondary | ICD-10-CM | POA: Diagnosis not present

## 2022-07-19 LAB — BLADDER SCAN AMB NON-IMAGING: Scan Result: 248

## 2022-07-19 MED ORDER — TAMSULOSIN HCL 0.4 MG PO CAPS: 0.4000 mg | ORAL_CAPSULE | Freq: Every day | ORAL | 0 refills | Status: DC

## 2022-07-19 MED ORDER — DOXYCYCLINE HYCLATE 100 MG PO CAPS: 100.0000 mg | ORAL_CAPSULE | Freq: Two times a day (BID) | ORAL | 0 refills | Status: DC

## 2022-07-19 NOTE — Patient Instructions (Addendum)
Start tamsulosis daily  Start Doxycycline for current infection  After completion of antibiotic for current infection, you will start a night time dose of antibiotics to prevent infection.

## 2022-07-19 NOTE — Progress Notes (Signed)
Assessment: 1. BPH with urinary obstruction - Urinalysis, Routine w reflex microscopic - BLADDER SCAN AMB NON-IMAGING  2. Incomplete bladder emptying  3. Acute cystitis without hematuria  4. Elevated PSA    Plan: Start Flomax and rx sent to pharmacy while waiting for mail order. Doxy for current UTI and will adjust pending MDX cx result. Will start suppression immediately after completing current tx. Recheck UA in 1 month for UA and PVR. Keep Nov appt for PSA and OV with Dr. Jeffie Pollock.  Meds ordered this encounter  Medications   doxycycline (VIBRAMYCIN) 100 MG capsule    Sig: Take 1 capsule (100 mg total) by mouth every 12 (twelve) hours.    Dispense:  42 capsule    Refill:  0   tamsulosin (FLOMAX) 0.4 MG CAPS capsule    Sig: Take 1 capsule (0.4 mg total) by mouth daily.    Dispense:  30 capsule    Refill:  0     Chief Complaint: No chief complaint on file.   HPI: Hector Welch. is a 76 y.o. male who presents for continued evaluation of BPH with urinary obstruction and recent UTI which grew Klebsiella.  Patient was treated with p.o. Keflex 1 month ago, but did not start hs suppression or tamsulosin as discussed.  Urine culture grew greater than 100 K colonies of Klebsiella pneumonia resistant to ampicillin.  Pt is only on finasteride today. Presents today for recheck urinalysis to ensure infection has resolved. Sxs include frequency and incomplete emptying. Recheck PSA scheduled for late November  UA= 11-30 WBCs, hyaline casts, many bacteria, nitrite positive PVR=273ml  06/10/22 06/10/22: Hector Welch returns today in f/u.  He remains on tamsulosin and finasteride.  HIs IPSS is 7 and his PVR is 152ml.  He has no dysuria or hematuria.  His UA has >30 WBC and bacteria.   He has no associated signs or symptoms.  His PSA is back up to 4.8 with a f/t ratio of 17.7 from 3.9 despite the finasteride but his urine looks infected again.     03/04/22: Hector Welch returns today in f/u. He remains on  tamsulosin and finasteride and is doing well with an IPSS of 12.  He has nocturia x 2.  His PVR is 161ml.  His PSA is down from 7.6 to 3.9 on finasteride.   He has had a prior UTI but no symptoms suggestive of a recurrence but his UA has 6-10 WBC and many bacteria.     11/26/21: Hector Welch returns today in f/u for cystoscopy for further evaluation of his LUTS and UTI history.  He has some frequency and urgency but can hold it better following treatment of the UTI.  He has  a reduced stream with some terminal dribbling.   He remains on tamsulosin 0.4mg  daily.   PVR today is >263ml.  PF on flowrate was 62ml/sec but that appeared to be an artifact.  The MF was 31ml/sec with 146ml voided.     10/08/21: Hector Welch returns today in f/u.   His PSA is up to 7.6 prior to this visit.  He has a reduced flow recent and has increased nocturia with small voids.  He has no dysuria or hematuria.  He has some malodorous urine and he had a low grade fever a few days ago.  He had an e. Coli UTI in June.  He has a refill on the bactrim but hasn't started it yet.  He has some tightness in the right calf without  tenderness.  He remains on tamsulosin.  His UA has 2+ LE.  His prior PVR was 153ml with a PF of 33ml/sec on a flowrate in 7/22.        07/09/21: Hector Welch is a 76 yo male who is sent by Dr. Legrand Rams for an elevated PSA of 4.38 in May.   His PSA is 4.6 with a 20.4% f/t ratio on repeat prior to this visit and in 7/20 it was 3.3.   He has moderate LUTS with urgency, nocturia 2x and frequency.  He had e. Coli on his culture in July and completed the bactrim.  His LUTS are improved.  He has no dysuria or hematuira.  His IPSS is 10-11 Portions of the above documentation were copied from a prior visit for review purposes only.  Allergies: No Known Allergies  PMH: Past Medical History:  Diagnosis Date   Astigmatism    Cataracts, bilateral    Cervicalgia    Cervicalgia    Chest pain, unspecified    Essential (primary)  hypertension    Gastro-esophageal reflux disease with esophagitis    GERD (gastroesophageal reflux disease)    Glaucoma    Hyperlipidemia, unspecified    Hypertension    Mini stroke 12/2017   Nontoxic multinodular goiter    Osteoarthritis    Other obstructive and reflux uropathy    Pain in joint involving shoulder region    Pain in right ankle and joints of right foot    Thyroid goiter    Transient cerebral ischemic attack, unspecified    Unspecified glaucoma     PSH: Past Surgical History:  Procedure Laterality Date   APPENDECTOMY  APH, 60's   COLONOSCOPY N/A 12/05/2012   Procedure: COLONOSCOPY;  Surgeon: Daneil Dolin, MD;  Location: [facility];  Service: Endoscopy;  Laterality: N/A;  1:00 PM   DENTAL SURGERY     THYROIDECTOMY  07/17/2018   THYROIDECTOMY N/A 07/17/2018   Procedure: TOTAL THYROIDECTOMY;  Surgeon: Armandina Gemma, MD;  Location: [facility];  Service: General;  Laterality: N/A;    SH: Social History   Tobacco Use   Smoking status: Former    Types: Cigarettes    Quit date: 1999    Years since quitting: 24.7   Smokeless tobacco: Never   Tobacco comments:    was a light smoker  Vaping Use   Vaping Use: Never used  Substance Use Topics   Alcohol use: No   Drug use: No    ROS: All other review of systems were reviewed and are negative except what is noted above in HPI  PE: BP 137/85   Pulse 62  GENERAL APPEARANCE:  Well appearing, well developed, well nourished, NAD HEENT:  Atraumatic, normocephalic NECK:  Supple. Trachea midline ABDOMEN:  Soft, non-tender, no masses EXTREMITIES:  Moves all extremities well, without clubbing, cyanosis, or edema NEUROLOGIC:  Alert and oriented x 3, normal gait, CN II-XII grossly intact MENTAL STATUS:  appropriate BACK:  Non-tender to palpation, No CVAT SKIN:  Warm, dry, and intact   Results: Laboratory Data: Lab Results  Component Value Date   WBC 3.6 05/07/2019   HGB 12.9 (A) 05/07/2019   HCT 38 (A)  05/07/2019   MCV 99.5 07/17/2018   PLT 142 (A) 05/07/2019    Lab Results  Component Value Date   CREATININE 1.15 11/27/2019    Lab Results  Component Value Date   PSA 3.3 05/07/2019     Urinalysis    Component Value Date/Time  APPEARANCEUR Hazy (A) 06/10/2022 1110   GLUCOSEU Negative 06/10/2022 1110   BILIRUBINUR Negative 06/10/2022 1110   PROTEINUR Negative 06/10/2022 1110   NITRITE Positive (A) 06/10/2022 1110   LEUKOCYTESUR 2+ (A) 06/10/2022 1110    Lab Results  Component Value Date   LABMICR See below: 06/10/2022   WBCUA >30 (A) 06/10/2022   LABEPIT 0-10 06/10/2022   BACTERIA Many (A) 06/10/2022    Pertinent Imaging: No results found for this or any previous visit.  No results found for this or any previous visit.  No results found for this or any previous visit.  No results found for this or any previous visit.  No results found for this or any previous visit.  No valid procedures specified. No results found for this or any previous visit.  No results found for this or any previous visit.  No results found for this or any previous visit (from the past 24 hour(s)).

## 2022-07-19 NOTE — Progress Notes (Unsigned)
3:01 PM  post void residual =248  MDX Culture# [id number] Confirmation# [id number]

## 2022-07-20 LAB — URINALYSIS, ROUTINE W REFLEX MICROSCOPIC
Bilirubin, UA: NEGATIVE
Glucose, UA: NEGATIVE
Ketones, UA: NEGATIVE
Nitrite, UA: POSITIVE — AB
Protein,UA: NEGATIVE
Specific Gravity, UA: 1.015 (ref 1.005–1.030)
Urobilinogen, Ur: 1 mg/dL (ref 0.2–1.0)
pH, UA: 5.5 (ref 5.0–7.5)

## 2022-07-20 LAB — MICROSCOPIC EXAMINATION

## 2022-07-26 DIAGNOSIS — I10 Essential (primary) hypertension: Secondary | ICD-10-CM | POA: Diagnosis not present

## 2022-07-26 DIAGNOSIS — K2100 Gastro-esophageal reflux disease with esophagitis, without bleeding: Secondary | ICD-10-CM | POA: Diagnosis not present

## 2022-08-03 ENCOUNTER — Encounter: Payer: Self-pay | Admitting: Physician Assistant

## 2022-08-18 ENCOUNTER — Ambulatory Visit (INDEPENDENT_AMBULATORY_CARE_PROVIDER_SITE_OTHER): Payer: Medicare Other | Admitting: Physician Assistant

## 2022-08-18 ENCOUNTER — Telehealth: Payer: Self-pay

## 2022-08-18 VITALS — BP 150/88 | HR 71

## 2022-08-18 DIAGNOSIS — R339 Retention of urine, unspecified: Secondary | ICD-10-CM | POA: Diagnosis not present

## 2022-08-18 DIAGNOSIS — R972 Elevated prostate specific antigen [PSA]: Secondary | ICD-10-CM

## 2022-08-18 DIAGNOSIS — N401 Enlarged prostate with lower urinary tract symptoms: Secondary | ICD-10-CM | POA: Diagnosis not present

## 2022-08-18 DIAGNOSIS — N138 Other obstructive and reflux uropathy: Secondary | ICD-10-CM | POA: Diagnosis not present

## 2022-08-18 LAB — URINALYSIS, ROUTINE W REFLEX MICROSCOPIC
Bilirubin, UA: NEGATIVE
Glucose, UA: NEGATIVE
Ketones, UA: NEGATIVE
Leukocytes,UA: NEGATIVE
Nitrite, UA: NEGATIVE
Protein,UA: NEGATIVE
Specific Gravity, UA: 1.015 (ref 1.005–1.030)
Urobilinogen, Ur: 0.2 mg/dL (ref 0.2–1.0)
pH, UA: 6 (ref 5.0–7.5)

## 2022-08-18 LAB — MICROSCOPIC EXAMINATION
Bacteria, UA: NONE SEEN
RBC, Urine: NONE SEEN /hpf (ref 0–2)
WBC, UA: NONE SEEN /hpf (ref 0–5)

## 2022-08-18 LAB — BLADDER SCAN AMB NON-IMAGING: Scan Result: 174

## 2022-08-18 MED ORDER — TAMSULOSIN HCL 0.4 MG PO CAPS: 0.4000 mg | ORAL_CAPSULE | Freq: Every day | ORAL | 3 refills | Status: DC

## 2022-08-18 NOTE — Telephone Encounter (Signed)
Created in Error

## 2022-08-18 NOTE — Progress Notes (Signed)
10:37 AM  post void residual =174

## 2022-08-18 NOTE — Progress Notes (Signed)
Assessment: 1. BPH with urinary obstruction - BLADDER SCAN AMB NON-IMAGING - Urinalysis, Routine w reflex microscopic - tamsulosin (FLOMAX) 0.4 MG CAPS capsule; Take 1 capsule (0.4 mg total) by mouth daily.  Dispense: 90 capsule; Refill: 3  2. Incomplete bladder emptying - tamsulosin (FLOMAX) 0.4 MG CAPS capsule; Take 1 capsule (0.4 mg total) by mouth daily.  Dispense: 90 capsule; Refill: 3  3. Elevated PSA    Plan: Continue current meds and hold hs Keflex until FU with Dr. Jeffie Pollock next month.  Tamsulosin refilled. after PSA as scheduled    No orders of the defined types were placed in this encounter.    Chief Complaint: No chief complaint on file.   HPI: Hector Welch. is a 76 y.o. male with h/o BPH and elevated PSA who presents for continued evaluation of recent UTI and incomplete emptying. Pt tx with Doxy and trial of Flomax started. Mdx Cx grew Klebsiella, sensitive to the Doxy he was Rx. Pt did not restart Keflex after completion of Doxy. No c/o today. No gross hematuria. Noct 2-3 x per night, but pt okay with this. Pt due for FU PSA with Dr. Jeffie Pollock scheduled in late November.  UA= clear PVR=125ml IPSS= 3    QOL=3  07/19/22 Creg Hector Welch. is a 76 y.o. male who presents for continued evaluation of BPH with urinary obstruction and recent UTI which grew Klebsiella.  Patient was treated with p.o. Keflex 1 month ago, but did not start hs suppression or tamsulosin as discussed.  Urine culture grew greater than 100 K colonies of Klebsiella pneumonia resistant to ampicillin.  Pt is only on finasteride today. Presents today for recheck urinalysis to ensure infection has resolved. Sxs include frequency and incomplete emptying. Recheck PSA scheduled for late November   UA= 11-30 WBCs, hyaline casts, many bacteria, nitrite positive PVR=271ml   06/10/22 06/10/22: Hector Welch returns today in f/u.  He remains on tamsulosin and finasteride.  HIs IPSS is 7 and his PVR is 163ml.  He has no  dysuria or hematuria.  His UA has >30 WBC and bacteria.   He has no associated signs or symptoms.  His PSA is back up to 4.8 with a f/t ratio of 17.7 from 3.9 despite the finasteride but his urine looks infected again.     03/04/22: Hector Welch returns today in f/u. He remains on tamsulosin and finasteride and is doing well with an IPSS of 12.  He has nocturia x 2.  His PVR is 198ml.  His PSA is down from 7.6 to 3.9 on finasteride.   He has had a prior UTI but no symptoms suggestive of a recurrence but his UA has 6-10 WBC and many bacteria.     11/26/21: Hector Welch returns today in f/u for cystoscopy for further evaluation of his LUTS and UTI history.  He has some frequency and urgency but can hold it better following treatment of the UTI.  He has  a reduced stream with some terminal dribbling.   He remains on tamsulosin 0.4mg  daily.   PVR today is >255ml.  PF on flowrate was 64ml/sec but that appeared to be an artifact.  The MF was 47ml/sec with 13ml voided.     10/08/21: Hector Welch returns today in f/u.   His PSA is up to 7.6 prior to this visit.  He has a reduced flow recent and has increased nocturia with small voids.  He has no dysuria or hematuria.  He has some malodorous urine and he  had a low grade fever a few days ago.  He had an e. Coli UTI in June.  He has a refill on the bactrim but hasn't started it yet.  He has some tightness in the right calf without tenderness.  He remains on tamsulosin.  His UA has 2+ LE.  His prior PVR was 142ml with a PF of 64ml/sec on a flowrate in 7/22.        07/09/21: Hector Welch is a 76 yo male who is sent by Dr. Legrand Rams for an elevated PSA of 4.38 in May.   His PSA is 4.6 with a 20.4% f/t ratio on repeat prior to this visit and in 7/20 it was 3.3.   He has moderate LUTS with urgency, nocturia 2x and frequency.  He had e. Coli on his culture in July and completed the bactrim.  His LUTS are improved.  He has no dysuria or hematuira.  His IPSS is 10-11  Portions of the above  documentation were copied from a prior visit for review purposes only.  Allergies: No Known Allergies  PMH: Past Medical History:  Diagnosis Date   Astigmatism    Cataracts, bilateral    Cervicalgia    Cervicalgia    Chest pain, unspecified    Essential (primary) hypertension    Gastro-esophageal reflux disease with esophagitis    GERD (gastroesophageal reflux disease)    Glaucoma    Hyperlipidemia, unspecified    Hypertension    Mini stroke 12/2017   Nontoxic multinodular goiter    Osteoarthritis    Other obstructive and reflux uropathy    Pain in joint involving shoulder region    Pain in right ankle and joints of right foot    Thyroid goiter    Transient cerebral ischemic attack, unspecified    Unspecified glaucoma     PSH: Past Surgical History:  Procedure Laterality Date   APPENDECTOMY  APH, 60's   COLONOSCOPY N/A 12/05/2012   Procedure: COLONOSCOPY;  Surgeon: Daneil Dolin, MD;  Location: [facility];  Service: Endoscopy;  Laterality: N/A;  1:00 PM   DENTAL SURGERY     THYROIDECTOMY  07/17/2018   THYROIDECTOMY N/A 07/17/2018   Procedure: TOTAL THYROIDECTOMY;  Surgeon: Armandina Gemma, MD;  Location: [place];  Service: General;  Laterality: N/A;    SH: Social History   Tobacco Use   Smoking status: Former    Types: Cigarettes    Quit date: 1999    Years since quitting: 24.8   Smokeless tobacco: Never   Tobacco comments:    was a light smoker  Vaping Use   Vaping Use: Never used  Substance Use Topics   Alcohol use: No   Drug use: No    ROS: All other review of systems were reviewed and are negative except what is noted above in HPI  PE: BP (!) 150/88   Pulse 71  GENERAL APPEARANCE:  Well appearing, well developed, well nourished, NAD HEENT:  Atraumatic, normocephalic NECK:  Supple. Trachea midline ABDOMEN:  Soft, non-tender, no masses EXTREMITIES:  Moves all extremities well, without clubbing, cyanosis, or edema NEUROLOGIC:  Alert and oriented x  3, normal gait, CN II-XII grossly intact MENTAL STATUS:  appropriate BACK:  Non-tender to palpation, No CVAT SKIN:  Warm, dry, and intact   Results: Laboratory Data: Lab Results  Component Value Date   WBC 3.6 05/07/2019   HGB 12.9 (A) 05/07/2019   HCT 38 (A) 05/07/2019   MCV 99.5 07/17/2018  PLT 142 (A) 05/07/2019    Lab Results  Component Value Date   CREATININE 1.15 11/27/2019    Lab Results  Component Value Date   PSA 3.3 05/07/2019    No results found for: "TESTOSTERONE"  No results found for: "HGBA1C"  Urinalysis    Component Value Date/Time   APPEARANCEUR Cloudy (A) 07/19/2022 1414   GLUCOSEU Negative 07/19/2022 1414   BILIRUBINUR Negative 07/19/2022 1414   PROTEINUR Negative 07/19/2022 1414   NITRITE Positive (A) 07/19/2022 1414   LEUKOCYTESUR Trace (A) 07/19/2022 1414    Lab Results  Component Value Date   LABMICR See below: 07/19/2022   WBCUA 11-30 (A) 07/19/2022   LABEPIT 0-10 07/19/2022   BACTERIA Many (A) 07/19/2022    Pertinent Imaging: No results found for this or any previous visit.  No results found for this or any previous visit.  No results found for this or any previous visit.  No results found for this or any previous visit.  No results found for this or any previous visit.  No valid procedures specified. No results found for this or any previous visit.  No results found for this or any previous visit.  No results found for this or any previous visit (from the past 24 hour(s)).

## 2022-08-19 DIAGNOSIS — Z0001 Encounter for general adult medical examination with abnormal findings: Secondary | ICD-10-CM | POA: Diagnosis not present

## 2022-08-19 DIAGNOSIS — I10 Essential (primary) hypertension: Secondary | ICD-10-CM | POA: Diagnosis not present

## 2022-08-19 DIAGNOSIS — E785 Hyperlipidemia, unspecified: Secondary | ICD-10-CM | POA: Diagnosis not present

## 2022-08-19 DIAGNOSIS — E039 Hypothyroidism, unspecified: Secondary | ICD-10-CM | POA: Diagnosis not present

## 2022-08-19 DIAGNOSIS — E755 Other lipid storage disorders: Secondary | ICD-10-CM | POA: Diagnosis not present

## 2022-08-25 DIAGNOSIS — E038 Other specified hypothyroidism: Secondary | ICD-10-CM | POA: Diagnosis not present

## 2022-08-25 DIAGNOSIS — I10 Essential (primary) hypertension: Secondary | ICD-10-CM | POA: Diagnosis not present

## 2022-08-25 DIAGNOSIS — Z1389 Encounter for screening for other disorder: Secondary | ICD-10-CM | POA: Diagnosis not present

## 2022-08-25 DIAGNOSIS — K2100 Gastro-esophageal reflux disease with esophagitis, without bleeding: Secondary | ICD-10-CM | POA: Diagnosis not present

## 2022-08-25 DIAGNOSIS — Z23 Encounter for immunization: Secondary | ICD-10-CM | POA: Diagnosis not present

## 2022-08-25 DIAGNOSIS — N400 Enlarged prostate without lower urinary tract symptoms: Secondary | ICD-10-CM | POA: Diagnosis not present

## 2022-08-25 DIAGNOSIS — Z1331 Encounter for screening for depression: Secondary | ICD-10-CM | POA: Diagnosis not present

## 2022-09-08 ENCOUNTER — Other Ambulatory Visit: Payer: Medicare Other

## 2022-09-08 DIAGNOSIS — R972 Elevated prostate specific antigen [PSA]: Secondary | ICD-10-CM | POA: Diagnosis not present

## 2022-09-09 LAB — PSA, TOTAL AND FREE
PSA, Free Pct: 14.5 %
PSA, Free: 0.42 ng/mL
Prostate Specific Ag, Serum: 2.9 ng/mL (ref 0.0–4.0)

## 2022-09-16 ENCOUNTER — Ambulatory Visit (INDEPENDENT_AMBULATORY_CARE_PROVIDER_SITE_OTHER): Payer: Medicare Other | Admitting: Urology

## 2022-09-16 VITALS — BP 133/85 | HR 60

## 2022-09-16 DIAGNOSIS — Z87440 Personal history of urinary (tract) infections: Secondary | ICD-10-CM | POA: Diagnosis not present

## 2022-09-16 DIAGNOSIS — N401 Enlarged prostate with lower urinary tract symptoms: Secondary | ICD-10-CM | POA: Diagnosis not present

## 2022-09-16 DIAGNOSIS — R351 Nocturia: Secondary | ICD-10-CM

## 2022-09-16 DIAGNOSIS — N138 Other obstructive and reflux uropathy: Secondary | ICD-10-CM | POA: Diagnosis not present

## 2022-09-16 DIAGNOSIS — R339 Retention of urine, unspecified: Secondary | ICD-10-CM | POA: Diagnosis not present

## 2022-09-16 DIAGNOSIS — N5201 Erectile dysfunction due to arterial insufficiency: Secondary | ICD-10-CM | POA: Diagnosis not present

## 2022-09-16 DIAGNOSIS — R972 Elevated prostate specific antigen [PSA]: Secondary | ICD-10-CM | POA: Diagnosis not present

## 2022-09-16 DIAGNOSIS — N390 Urinary tract infection, site not specified: Secondary | ICD-10-CM

## 2022-09-16 LAB — BLADDER SCAN: Scan Result: 139

## 2022-09-16 MED ORDER — SILDENAFIL CITRATE 20 MG PO TABS: ORAL_TABLET | ORAL | 11 refills | Status: DC

## 2022-09-16 NOTE — Progress Notes (Signed)
Subjective: 1. BPH with urinary obstruction   2. Incomplete bladder emptying   3. Nocturia   4. Elevated PSA   5. Urinary tract infection without hematuria, site unspecified   6. Erectile dysfunction due to arterial insufficiency    09/16/22: Hector Welch returns today in f/u.  He was taken off of Keflex suppression earlier this month and is doing well without symptoms of recurrent UTI.  He remains on tamsulosin and finasteride. His PSA has continued to fall and is down to 2.9 with the finasteride.  He continues to have issues with ED and hasn't tried any treatments. He has sporadic function.   06/10/22: Hector Welch returns today in f/u.  He remains on tamsulosin and finasteride.  HIs IPSS is 7 and his PVR is .  He has no dysuria or hematuria.  His UA has >30 WBC and bacteria.   He has no associated signs or symptoms.  His PSA is back up to 4.8 with a f/t ratio of 17.7 from 3.9 despite the finasteride but his urine looks infected again.    03/04/22: Hector Welch returns today in f/u. He remains on tamsulosin and finasteride and is doing well with an IPSS of 12.  He has nocturia x 2.  His PVR is .  His PSA is down from 7.6 to 3.9 on finasteride.   He has had a prior UTI but no symptoms suggestive of a recurrence but his UA has 6-10 WBC and many bacteria.    11/26/21: Hector Welch returns today in f/u for cystoscopy for further evaluation of his LUTS and UTI history.  He has some frequency and urgency but can hold it better following treatment of the UTI.  He has  a reduced stream with some terminal dribbling.   He remains on tamsulosin 0.4mg  daily.   PVR today is >264ml.  PF on flowrate was 30ml/sec but that appeared to be an artifact.  The MF was 6ml/sec with voided.    10/08/21: Hector Welch returns today in f/u.   His PSA is up to 7.6 prior to this visit.  He has a reduced flow recent and has increased nocturia with small voids.  He has no dysuria or hematuria.  He has some malodorous urine and he had a low grade  fever a few days ago.  He had an e. Coli UTI in June.  He has a refill on the bactrim but hasn't started it yet.  He has some tightness in the right calf without tenderness.  He remains on tamsulosin.  His UA has 2+ LE.  His prior PVR was with a PF of 29ml/sec on a flowrate in 7/22.      07/09/21: Hector Welch is a 76 yo male who is sent by Dr. Felecia Shelling for an elevated PSA of 4.38 in May.   His PSA is 4.6 with a 20.4% f/t ratio on repeat prior to this visit and in 7/20 it was 3.3.   He has moderate LUTS with urgency, nocturia 2x and frequency.  He had e. Coli on his culture in July and completed the bactrim.  His LUTS are improved.  He has no dysuria or hematuira.  His IPSS is 10-11   IPSS     Row Name 09/16/22 1100         International Prostate Symptom Score   How often have you had the sensation of not emptying your bladder? Less than 1 in 5     How often have you had to  urinate less than every two hours? Less than half the time     How often have you found you stopped and started again several times when you urinated? Not at All     How often have you found it difficult to postpone urination? Not at All     How often have you had a weak urinary stream? Not at All     How often have you had to strain to start urination? Not at All     How many times did you typically get up at night to urinate? 2 Times     Total IPSS Score 5       Quality of Life due to urinary symptoms   If you were to spend the rest of your life with your urinary condition just the way it is now how would you feel about that? Mostly Satisfied                ROS:  Review of Systems  All other systems reviewed and are negative.   No Known Allergies  Past Medical History:  Diagnosis Date   Astigmatism    Cataracts, bilateral    Cervicalgia    Cervicalgia    Chest pain, unspecified    Essential (primary) hypertension    Gastro-esophageal reflux disease with esophagitis    GERD (gastroesophageal  reflux disease)    Glaucoma    Hyperlipidemia, unspecified    Hypertension    Mini stroke 12/2017   Nontoxic multinodular goiter    Osteoarthritis    Other obstructive and reflux uropathy    Pain in joint involving shoulder region    Pain in right ankle and joints of right foot    Thyroid goiter    Transient cerebral ischemic attack, unspecified    Unspecified glaucoma     Past Surgical History:  Procedure Laterality Date   APPENDECTOMY  APH, 60's   COLONOSCOPY N/A 12/05/2012   Procedure: COLONOSCOPY;  Surgeon: Corbin Adeobert M Rourk, MD;  Location: [facility];  Service: Endoscopy;  Laterality: N/A;  1:00 PM   DENTAL SURGERY     THYROIDECTOMY  07/17/2018   THYROIDECTOMY N/A 07/17/2018   Procedure: TOTAL THYROIDECTOMY;  Surgeon: Darnell LevelGerkin, Todd, MD;  Location: [facility];  Service: General;  Laterality: N/A;    Social History   Socioeconomic History   Marital status: Married    Spouse name: Not on file   Number of children: 4   Years of education: 12   Highest education level: Not on file  Occupational History   Occupation: Retired  Tobacco Use   Smoking status: Former    Types: Cigarettes    Quit date: 1999    Years since quitting: 24.9   Smokeless tobacco: Never   Tobacco comments:    was a light smoker  Building services engineerVaping Use   Vaping Use: Never used  Substance and Sexual Activity   Alcohol use: No   Drug use: No   Sexual activity: Not Currently  Other Topics Concern   Not on file  Social History Narrative   Lives w/ wife   Caffeine use: Coffee daily   Right handed    Social Determinants of Health   Financial Resource Strain: Not on file  Food Insecurity: Not on file  Transportation Needs: Not on file  Physical Activity: Not on file  Stress: Not on file  Social Connections: Not on file  Intimate Partner Violence: Not on file    Family History  Problem  Relation Age of Onset   Cancer Mother    Cancer Father    Cancer Sister    Cancer Brother      Anti-infectives: Anti-infectives (From admission, onward)    None       Current Outpatient Medications  Medication Sig Dispense Refill   sildenafil (REVATIO) 20 MG tablet Take 1-5 tablets po daily as needed (start with 2-3) 30 tablet 11   atenolol (TENORMIN) 50 MG tablet Take 50 mg by mouth daily.     atorvastatin (LIPITOR) 20 MG tablet Take 20 mg by mouth daily.     finasteride (PROSCAR) 5 MG tablet Take 1 tablet (5 mg total) by mouth daily. 90 tablet 3   levothyroxine (SYNTHROID) 75 MCG tablet Take 75 mcg by mouth every morning.     losartan (COZAAR) 100 MG tablet Take 100 mg by mouth daily.     nebivolol (BYSTOLIC) 10 MG tablet Take 10 mg by mouth at bedtime.     tamsulosin (FLOMAX) 0.4 MG CAPS capsule Take 1 capsule (0.4 mg total) by mouth daily. 90 capsule 3   No current facility-administered medications for this visit.     Objective: Vital signs in last 24 hours: BP 133/85   Pulse 60   Intake/Output from previous day: No intake/output data recorded. Intake/Output this shift: @IOTHISSHIFT @   Physical Exam  Lab Results:  Results for orders placed or performed in visit on 09/16/22 (from the past 24 hour(s))  Urinalysis, Routine w reflex microscopic     Status: Abnormal   Collection Time: 09/16/22 11:08 AM  Result Value Ref Range   Specific Gravity, UA 1.020 1.005 - 1.030   pH, UA 6.0 5.0 - 7.5   Color, UA Yellow Yellow   Appearance Ur Clear Clear   Leukocytes,UA Negative Negative   Protein,UA Negative Negative/Trace   Glucose, UA Negative Negative   Ketones, UA Negative Negative   RBC, UA Trace (A) Negative   Bilirubin, UA Negative Negative   Urobilinogen, Ur 1.0 0.2 - 1.0 mg/dL   Nitrite, UA Negative Negative   Microscopic Examination See below:    Narrative   Performed at:  [address] - [facility] [address] [place], [place]  [place] Lab Director: [id number], Phone:  [phone]  Microscopic Examination     Status: None    Collection Time: 09/16/22 11:08 AM   Urine  Result Value Ref Range   WBC, UA 0-5 0 - 5 /hpf   RBC, Urine 0-2 0 - 2 /hpf   Epithelial Cells (non renal) 0-10 0 - 10 /hpf   Bacteria, UA None seen None seen/Few   Narrative   Performed at:  [address] - [facility] [place] [address] [place], [place]  [place] Lab Director: [id number] [place], Phone:  [phone]     UA has >30 WBC and many bacteria.     BMET No results for input(s): "NA", "K", "CL", "CO2", "GLUCOSE", "BUN", "CREATININE", "CALCIUM" in the last 72 hours. PT/INR No results for input(s): "LABPROT", "INR" in the last 72 hours. ABG No results for input(s): "PHART", "HCO3" in the last 72 hours.  Invalid input(s): "PCO2", "PO2" UA has >30 WBC with many bacteria.  Studies/Results: No results found.   Assessment/Plan: Elevated PSA with recent UTI.   His PSA is back up to 4.8 after falling to 3.9 from 7.6 but his UA does look infected again today.  I will repeat the PSA in 3 month and have him continue the finasteride.  I  have sent a urine culture and if positive, I will treat and put him on suppressive therapy.   BPH with BOO.  He is voiding better with the addition of finasteride to the tamsulosin and will continue that.  HIs PVR is stable at .     Urethral stricture.  Mild/moderate 3cm bulbar stricture was found on cystoscopy.  He has a good stream.  ED.  We didn't address this today.   Meds ordered this encounter  Medications   sildenafil (REVATIO) 20 MG tablet    Sig: Take 1-5 tablets po daily as needed (start with 2-3)    Dispense:  30 tablet    Refill:  11       Orders Placed This Encounter  Procedures   Microscopic Examination   Urinalysis, Routine w reflex microscopic   PSA, total and free    Standing Status:   Future    Standing Expiration Date:   09/17/2023   Bladder scan      Return in about 6 months (around 03/17/2023) for PVR on return.    CC: Avon Gully MD.     Charlton Boule 09/17/2022 [phone] Patient ID: [name], male   DOB: [birth year], 76 y.o.   MRN: [id number]

## 2022-09-16 NOTE — Progress Notes (Signed)
Pt here today for bladder scan. Bladder was scanned and 139 was visualized.    Performed by Kennyth Lose, CMA

## 2022-09-17 ENCOUNTER — Encounter: Payer: Self-pay | Admitting: Urology

## 2022-09-17 LAB — URINALYSIS, ROUTINE W REFLEX MICROSCOPIC
Bilirubin, UA: NEGATIVE
Glucose, UA: NEGATIVE
Ketones, UA: NEGATIVE
Leukocytes,UA: NEGATIVE
Nitrite, UA: NEGATIVE
Protein,UA: NEGATIVE
Specific Gravity, UA: 1.02 (ref 1.005–1.030)
Urobilinogen, Ur: 1 mg/dL (ref 0.2–1.0)
pH, UA: 6 (ref 5.0–7.5)

## 2022-09-17 LAB — MICROSCOPIC EXAMINATION: Bacteria, UA: NONE SEEN

## 2022-09-24 DIAGNOSIS — K2100 Gastro-esophageal reflux disease with esophagitis, without bleeding: Secondary | ICD-10-CM | POA: Diagnosis not present

## 2022-09-24 DIAGNOSIS — I10 Essential (primary) hypertension: Secondary | ICD-10-CM | POA: Diagnosis not present

## 2022-10-25 DIAGNOSIS — E038 Other specified hypothyroidism: Secondary | ICD-10-CM | POA: Diagnosis not present

## 2022-10-25 DIAGNOSIS — I10 Essential (primary) hypertension: Secondary | ICD-10-CM | POA: Diagnosis not present

## 2022-11-08 ENCOUNTER — Encounter: Payer: Self-pay | Admitting: *Deleted

## 2022-11-25 DIAGNOSIS — I10 Essential (primary) hypertension: Secondary | ICD-10-CM | POA: Diagnosis not present

## 2022-11-25 DIAGNOSIS — E038 Other specified hypothyroidism: Secondary | ICD-10-CM | POA: Diagnosis not present

## 2022-12-01 DIAGNOSIS — H25813 Combined forms of age-related cataract, bilateral: Secondary | ICD-10-CM | POA: Diagnosis not present

## 2022-12-24 DIAGNOSIS — I10 Essential (primary) hypertension: Secondary | ICD-10-CM | POA: Diagnosis not present

## 2022-12-24 DIAGNOSIS — E038 Other specified hypothyroidism: Secondary | ICD-10-CM | POA: Diagnosis not present

## 2022-12-29 DIAGNOSIS — H2513 Age-related nuclear cataract, bilateral: Secondary | ICD-10-CM | POA: Diagnosis not present

## 2022-12-29 DIAGNOSIS — H40003 Preglaucoma, unspecified, bilateral: Secondary | ICD-10-CM | POA: Diagnosis not present

## 2022-12-29 DIAGNOSIS — H524 Presbyopia: Secondary | ICD-10-CM | POA: Diagnosis not present

## 2022-12-29 DIAGNOSIS — H5203 Hypermetropia, bilateral: Secondary | ICD-10-CM | POA: Diagnosis not present

## 2022-12-29 DIAGNOSIS — H16223 Keratoconjunctivitis sicca, not specified as Sjogren's, bilateral: Secondary | ICD-10-CM | POA: Diagnosis not present

## 2022-12-29 DIAGNOSIS — H43811 Vitreous degeneration, right eye: Secondary | ICD-10-CM | POA: Diagnosis not present

## 2023-01-03 ENCOUNTER — Other Ambulatory Visit: Payer: Self-pay | Admitting: Urology

## 2023-01-12 DIAGNOSIS — H2513 Age-related nuclear cataract, bilateral: Secondary | ICD-10-CM | POA: Diagnosis not present

## 2023-01-12 DIAGNOSIS — H40003 Preglaucoma, unspecified, bilateral: Secondary | ICD-10-CM | POA: Diagnosis not present

## 2023-01-19 NOTE — H&P (Signed)
Surgical History & Physical  Patient Name: Hector Welch  DOB: Feb 27, 1946  Surgery: Cataract extraction with intraocular lens implant phacoemulsification; Right Eye Surgeon: Ronn Melena MD Surgery Date: 01/28/2023 Pre-Op Date: 12/29/2022  HPI: A 19 Yr. old male patient present for cataract and glaucoma eval per Dr. Jorja Loa. 1. The patient complains of difficulty when reading fine print, books, newspaper, instructions etc., which began for an unknown amount of time. Both eyes are affected. The episode is constant. The condition's severity is worsening. Vision looks foggy. When driving on sunny days it appears his windshield is dirty but it is the fogginess from his vision. This is negatively affecting the patient's quality of life and the patient is unable to function adequately in life with the current level of vision. Denies using eye drops.  Medical History: Dry Eyes Glaucoma Cataracts  High Blood Pressure LDL Stroke acid reflux, migraines, enlarged prostate  Review of Systems Cardiovascular High Blood Pressure All recorded systems are negative except as noted above.  Social Former smoker of Cigarettes   Medication Latanoprost,  BP pill, Cholesterol pill, Potassium chloride, Tamsulosin, Thyroid pill   Sx/Procedures Appendectomy, Thyroidectomy   Drug Allergies  NKDA  History & Physical: Heent: cataracts NECK: supple without bruits LUNGS: lungs clear to auscultation CV: regular rate and rhythm Abdomen: soft and non-tender  Impression & Plan: Assessment: 1.  CATARACT AGE-RELATED NUCLEAR; Both Eyes (H25.13) 2.  GLAUCOMA SUSPECT; Both Eyes (H40.003) 3.  PVD - new (posterior vitreous detachment); Right Eye (H43.811) 4.  KERATOCONJUNCTIVITIS SICCA NOT SPECIFIED AS SJORGRENS; Both Eyes (H16.223) 5.  Pinguecula; Both Eyes (H11.153) 6.  Hyperopia ; Both Eyes (H52.03) 7.  PRESBYOPIA (H52.4)  Plan: 1.  Cataracts are visually significant and account for the patient's  complaints. Discussed all risks, benefits, procedures and recovery, including infection, loss of vision and eye, need for glasses after surgery or additional procedures. Patient understands changing glasses will not improve vision. Patient indicated understanding of procedure. All questions answered. Patient desires to have surgery, recommend phacoemulsification with intraocular lens. Patient to have preliminary testing necessary ([employer]/IOL Master, Mac OCT, TOPO) Educational materials provided:Cataract.  Will repeat CE measurements due to DES. Consider MIGS.  RECOMMEND STANDARD OD first  2.  Glaucoma: Findings, prognosis and treatment options reviewed. Glaucoma is a slow progressive disease often causing painless vision loss. Compliance with treatment discussed, importance of regular follow ups and regular testing understood. Discussed that he is a suspect v. POAG due to IOP being controlled. Will assess further with HVF data. Patient is to begin Latanoprost qhs OU.  Current gtts: Intolerances: Surgical Hx:  Family Hx:  Tmax: Last DFE: (12/29/22)  Gonio ( / / ) CCT: 466/469 (12/2022)  OCT (12/29/22): OD: 73 Thin Inf OS: 65 Thin Inf, Borderline T/N  VF 24-2 ( / / ) OD: OS:  3.  Explained the presence of PVD. Natural aging process of the vitreous gel discussed Signs and symptoms of RD reviewed Patient advised to call ASAP if vision decreases or symptoms increase.  4.  Findings, prognosis and treatment options reviewed. Start basic treatment regimen; Begin warm compresses in the mornings and use of artificial tears 3-4x  5.  Observe; Artificial tears as needed for irritation.  6.  Glasses Rx not updated and given today.  7.  Glasses Rx not updated and given today.

## 2023-01-21 DIAGNOSIS — H2511 Age-related nuclear cataract, right eye: Secondary | ICD-10-CM | POA: Diagnosis not present

## 2023-01-24 ENCOUNTER — Encounter (HOSPITAL_COMMUNITY)
Admission: RE | Admit: 2023-01-24 | Discharge: 2023-01-24 | Disposition: A | Discharge status: Home / Self Care | Payer: Medicare Other | Source: Ambulatory Visit | Attending: Optometry | Admitting: Optometry

## 2023-01-24 ENCOUNTER — Encounter (HOSPITAL_COMMUNITY): Payer: Self-pay

## 2023-01-24 DIAGNOSIS — E038 Other specified hypothyroidism: Secondary | ICD-10-CM | POA: Diagnosis not present

## 2023-01-24 DIAGNOSIS — I10 Essential (primary) hypertension: Secondary | ICD-10-CM | POA: Diagnosis not present

## 2023-01-24 HISTORY — DX: Benign prostatic hyperplasia without lower urinary tract symptoms: N40.0

## 2023-01-28 ENCOUNTER — Ambulatory Visit (HOSPITAL_COMMUNITY): Payer: Medicare Other | Admitting: Certified Registered"

## 2023-01-28 ENCOUNTER — Ambulatory Visit (HOSPITAL_COMMUNITY)
Admission: RE | Admit: 2023-01-28 | Discharge: 2023-01-28 | Disposition: A | Discharge status: Home / Self Care | Payer: Medicare Other | Attending: Optometry | Admitting: Optometry

## 2023-01-28 ENCOUNTER — Encounter (HOSPITAL_COMMUNITY)
Admission: RE | Disposition: A | Discharge status: Home / Self Care | Payer: Self-pay | Source: Home / Self Care | Attending: Optometry

## 2023-01-28 ENCOUNTER — Ambulatory Visit (HOSPITAL_BASED_OUTPATIENT_CLINIC_OR_DEPARTMENT_OTHER): Payer: Medicare Other | Admitting: Certified Registered"

## 2023-01-28 DIAGNOSIS — H409 Unspecified glaucoma: Secondary | ICD-10-CM | POA: Diagnosis not present

## 2023-01-28 DIAGNOSIS — E785 Hyperlipidemia, unspecified: Secondary | ICD-10-CM | POA: Insufficient documentation

## 2023-01-28 DIAGNOSIS — H2511 Age-related nuclear cataract, right eye: Secondary | ICD-10-CM | POA: Diagnosis not present

## 2023-01-28 DIAGNOSIS — Z87891 Personal history of nicotine dependence: Secondary | ICD-10-CM | POA: Diagnosis not present

## 2023-01-28 DIAGNOSIS — K219 Gastro-esophageal reflux disease without esophagitis: Secondary | ICD-10-CM | POA: Insufficient documentation

## 2023-01-28 DIAGNOSIS — I10 Essential (primary) hypertension: Secondary | ICD-10-CM | POA: Insufficient documentation

## 2023-01-28 DIAGNOSIS — E039 Hypothyroidism, unspecified: Secondary | ICD-10-CM | POA: Diagnosis not present

## 2023-01-28 HISTORY — PX: CATARACT EXTRACTION W/PHACO: SHX586

## 2023-01-28 SURGERY — PHACOEMULSIFICATION, CATARACT, WITH IOL INSERTION
Anesthesia: Monitor Anesthesia Care | Site: Eye | Laterality: Right

## 2023-01-28 MED ORDER — TROPICAMIDE 1 % OP SOLN
1.0000 [drp] | OPHTHALMIC | Status: AC
  Administered 2023-01-28 (×3): 1 [drp] via OPHTHALMIC

## 2023-01-28 MED ORDER — STERILE WATER FOR IRRIGATION IR SOLN
Status: DC | PRN
  Administered 2023-01-28: 250 mL

## 2023-01-28 MED ORDER — SIGHTPATH DOSE#1 NA HYALUR & NA CHOND-NA HYALUR IO KIT
PACK | INTRAOCULAR | Status: DC | PRN
  Administered 2023-01-28: 1 via OPHTHALMIC

## 2023-01-28 MED ORDER — SODIUM CHLORIDE 0.9% FLUSH
INTRAVENOUS | Status: DC | PRN
  Administered 2023-01-28: 5 mL via INTRAVENOUS

## 2023-01-28 MED ORDER — BSS IO SOLN
INTRAOCULAR | Status: DC | PRN
  Administered 2023-01-28: 15 mL via INTRAOCULAR

## 2023-01-28 MED ORDER — FENTANYL CITRATE (PF) 100 MCG/2ML IJ SOLN
INTRAMUSCULAR | Status: AC
  Filled 2023-01-28: qty 2

## 2023-01-28 MED ORDER — TETRACAINE HCL 0.5 % OP SOLN
1.0000 [drp] | OPHTHALMIC | Status: AC
  Administered 2023-01-28 (×3): 1 [drp] via OPHTHALMIC

## 2023-01-28 MED ORDER — MIDAZOLAM HCL 2 MG/2ML IJ SOLN
INTRAMUSCULAR | Status: AC
  Filled 2023-01-28: qty 2

## 2023-01-28 MED ORDER — NEOMYCIN-POLYMYXIN-DEXAMETH 3.5-10000-0.1 OP SUSP
OPHTHALMIC | Status: DC | PRN
  Administered 2023-01-28: 2 [drp] via OPHTHALMIC

## 2023-01-28 MED ORDER — LIDOCAINE HCL 3.5 % OP GEL
1.0000 | Freq: Once | OPHTHALMIC | Status: AC
  Administered 2023-01-28: 1 via OPHTHALMIC

## 2023-01-28 MED ORDER — PHENYLEPHRINE-KETOROLAC 1-0.3 % IO SOLN
INTRAOCULAR | Status: DC | PRN
  Administered 2023-01-28: 500 mL via OPHTHALMIC

## 2023-01-28 MED ORDER — FENTANYL CITRATE (PF) 100 MCG/2ML IJ SOLN
INTRAMUSCULAR | Status: DC | PRN
  Administered 2023-01-28: 50 ug via INTRAVENOUS

## 2023-01-28 MED ORDER — POVIDONE-IODINE 5 % OP SOLN
OPHTHALMIC | Status: DC | PRN
  Administered 2023-01-28: 1 via OPHTHALMIC

## 2023-01-28 MED ORDER — PHENYLEPHRINE HCL 2.5 % OP SOLN
1.0000 [drp] | OPHTHALMIC | Status: AC
  Administered 2023-01-28 (×2): 1 [drp] via OPHTHALMIC

## 2023-01-28 MED ORDER — LIDOCAINE HCL (PF) 1 % IJ SOLN
INTRAMUSCULAR | Status: DC | PRN
  Administered 2023-01-28: 1 mL

## 2023-01-28 MED ORDER — MIDAZOLAM HCL 2 MG/2ML IJ SOLN
INTRAMUSCULAR | Status: DC | PRN
  Administered 2023-01-28: 1 mg via INTRAVENOUS

## 2023-01-28 SURGICAL SUPPLY — 15 items
CATARACT SUITE SIGHTPATH (MISCELLANEOUS) ×1 IMPLANT
CLOTH BEACON ORANGE TIMEOUT ST (SAFETY) ×1 IMPLANT
DRSG TEGADERM 4X4.75 (GAUZE/BANDAGES/DRESSINGS) ×1 IMPLANT
EYE SHIELD UNIVERSAL CLEAR (GAUZE/BANDAGES/DRESSINGS) IMPLANT
FEE CATARACT SUITE SIGHTPATH (MISCELLANEOUS) ×1 IMPLANT
GLOVE BIOGEL PI IND STRL 7.0 (GLOVE) ×2 IMPLANT
LENS IOL RAYNER 22.0 (Intraocular Lens) ×1 IMPLANT
LENS IOL RAYONE EMV 22.0 (Intraocular Lens) IMPLANT
NDL HYPO 18GX1.5 BLUNT FILL (NEEDLE) ×1 IMPLANT
NEEDLE HYPO 18GX1.5 BLUNT FILL (NEEDLE) ×1 IMPLANT
PAD ARMBOARD 7.5X6 YLW CONV (MISCELLANEOUS) ×1 IMPLANT
RING MALYGIN 7.0 (MISCELLANEOUS) IMPLANT
SYR TB 1ML LL NO SAFETY (SYRINGE) ×1 IMPLANT
TAPE SURG TRANSPORE 1 IN (GAUZE/BANDAGES/DRESSINGS) IMPLANT
WATER STERILE IRR 250ML POUR (IV SOLUTION) ×1 IMPLANT

## 2023-01-28 NOTE — Anesthesia Preprocedure Evaluation (Signed)
Anesthesia Evaluation  Patient identified by MRN, date of birth, ID band Patient awake    Reviewed: Allergy & Precautions, H&P , NPO status , Patient's Chart, lab work & pertinent test results, reviewed documented beta blocker date and time   Airway Mallampati: II  TM Distance: >3 FB Neck ROM: full    Dental no notable dental hx.    Pulmonary neg pulmonary ROS, former smoker   Pulmonary exam normal breath sounds clear to auscultation       Cardiovascular Exercise Tolerance: Good hypertension, negative cardio ROS  Rhythm:regular Rate:Normal     Neuro/Psych negative neurological ROS  negative psych ROS   GI/Hepatic negative GI ROS, Neg liver ROS,GERD  ,,  Endo/Other  negative endocrine ROSHypothyroidism    Renal/GU negative Renal ROS  negative genitourinary   Musculoskeletal   Abdominal   Peds  Hematology negative hematology ROS (+)   Anesthesia Other Findings   Reproductive/Obstetrics negative OB ROS                             Anesthesia Physical Anesthesia Plan  ASA: 3  Anesthesia Plan: MAC   Post-op Pain Management:    Induction:   PONV Risk Score and Plan:   Airway Management Planned:   Additional Equipment:   Intra-op Plan:   Post-operative Plan:   Informed Consent: I have reviewed the patients History and Physical, chart, labs and discussed the procedure including the risks, benefits and alternatives for the proposed anesthesia with the patient or authorized representative who has indicated his/her understanding and acceptance.     Dental Advisory Given  Plan Discussed with: CRNA  Anesthesia Plan Comments:        Anesthesia Quick Evaluation

## 2023-01-28 NOTE — Transfer of Care (Signed)
Immediate Anesthesia Transfer of Care Note  Patient: Hector Welch.  Procedure(s) Performed: CATARACT EXTRACTION PHACO AND INTRAOCULAR LENS PLACEMENT (IOC) (Right: Eye)  Patient Location: Short Stay  Anesthesia Type:MAC  Level of Consciousness: awake, alert , and oriented  Airway & Oxygen Therapy: Patient Spontanous Breathing  Post-op Assessment: Report given to RN and Post -op Vital signs reviewed and stable  Post vital signs: Reviewed and stable  Last Vitals:  Vitals Value Taken Time  BP    Temp    Pulse    Resp    SpO2      Last Pain:  Vitals:   01/28/23 0827  TempSrc: Oral  PainSc: 0-No pain      Patients Stated Pain Goal: 6 (01/28/23 0827)  Complications: No notable events documented.

## 2023-01-28 NOTE — Discharge Instructions (Signed)
Please discharge patient when stable, will follow up today with Dr. Rhya Shan at the [facility] office immediately following discharge.  Leave shield in place until visit.  All paperwork with discharge instructions will be given at the office.  [facility] Address:  [address]  [place],  [address]  Dr. Elisea Khader's Phone: [phone]  

## 2023-01-28 NOTE — Anesthesia Postprocedure Evaluation (Signed)
Anesthesia Post Note  Patient: Hector Welch.  Procedure(s) Performed: CATARACT EXTRACTION PHACO AND INTRAOCULAR LENS PLACEMENT (IOC) (Right: Eye)  Anesthesia Type: MAC Anesthetic complications: no   No notable events documented.   Last Vitals:  Vitals:   01/28/23 0827 01/28/23 0924  BP: 125/72 125/82  Pulse: (!) 48 (!) 48  Resp: 14 11  Temp: 36.5 C 36.8 C  SpO2: 100% 100%    Last Pain:  Vitals:   01/28/23 0924  TempSrc: Oral  PainSc: 0-No pain                 Windell Norfolk

## 2023-01-28 NOTE — Interval H&P Note (Signed)
History and Physical Interval Note:  01/28/2023 8:19 AM  The H and P was reviewed and updated. The patient was examined.  No changes were found after exam.  The surgical eye was marked.  Nissa Stannard

## 2023-01-28 NOTE — Anesthesia Procedure Notes (Signed)
Procedure Name: MAC Date/Time: 01/28/2023 9:05 AM  Performed by: Julian Reil, CRNAPre-anesthesia Checklist: Patient identified, Emergency Drugs available, Suction available and Patient being monitored Patient Re-evaluated:Patient Re-evaluated prior to induction Oxygen Delivery Method: Nasal cannula Placement Confirmation: positive ETCO2

## 2023-01-28 NOTE — Op Note (Addendum)
Date of procedure: 01/28/23  Pre-operative diagnosis: Visually significant age-related nuclear cataract, Right Eye (H25.11)  Post-operative diagnosis: Visually significant age-related nuclear cataract, Right Eye  Procedure: Removal of cataract via phacoemulsification and insertion of intra-ocular lens Rayner [id number] +22.0D into the capsular bag of the Right Eye  Attending surgeon: Pecolia Ades, MD  Anesthesia: MAC, Topical Akten  Complications: None  Estimated Blood Loss: <35mL (minimal)  Specimens: None  Implants:  Implant Name Type Inv. Item Serial No. Manufacturer Lot No. LRB No. Used Action  LENS IOL RAYNER 22.0 - S38 Intraocular Lens LENS IOL RAYNER 22.0 38 [employer] [id number] Right 1 Implanted    Indications:  Visually significant age-related cataract, Right Eye  Procedure:  The patient was seen and identified in the pre-operative area. The operative eye was identified and dilated.  The operative eye was marked.  Topical anesthesia was administered to the operative eye.     The patient was then to the operative suite and placed in the supine position.  A timeout was performed confirming the patient, procedure to be performed, and all other relevant information.   The patient's face was prepped and draped in the usual fashion for intra-ocular surgery.  A lid speculum was placed into the operative eye and the surgical microscope moved into place and focused.  A superotemporal paracentesis was created using a 20 gauge paracentesis blade.  BSS mixed with Omidria, followed by 1% lidocaine was injected into the anterior chamber.  Viscoelastic was injected into the anterior chamber.  A temporal clear-corneal main wound incision was created using a 2.104mm microkeratome.  A continuous curvilinear capsulorrhexis was initiated using an irrigating cystitome and completed using capsulorrhexis forceps.  Hydrodissection and hydrodeliniation were performed.  Viscoelastic was injected into the  anterior chamber.  A phacoemulsification handpiece and a chopper as a second instrument were used to remove the nucleus and epinucleus. The irrigation/aspiration handpiece was used to remove any remaining cortical material.   The capsular bag was reinflated with viscoelastic, checked, and found to be intact.  The intraocular lens was inserted into the capsular bag.  The irrigation/aspiration handpiece was used to remove any remaining viscoelastic.  The clear corneal wound and paracentesis wounds were then hydrated and checked with Weck-Cels to be watertight.  The lid-speculum and drape was removed, and the patient's face was cleaned with a wet and dry 4x4.  Maxitrol drops were instilled onto the eye. A clear shield was taped over the eye. The patient was taken to the post-operative care unit in good condition, having tolerated the procedure well.  Post-Op Instructions: The patient will follow up at [facility] for a same day post-operative evaluation and will receive all other orders and instructions.

## 2023-02-02 NOTE — H&P (Signed)
Surgical History & Physical  Patient Name: Hector Welch  DOB: Nov 14, 1945  Surgery: Cataract extraction with intraocular lens implant phacoemulsification; Left Eye Surgeon: Pecolia Ades MD Surgery Date: 02/11/2023 Pre-Op Date: 02/01/2023  HPI: A 27 Yr. old male patient present for 4 day post op OD. Patient is happy with sx results. Patient is using Combo gtt TID OD. He has not used Latanoprost since cataract sx. I explained he is to start back on Latanoprost and not to stop it. Difficulties with seeing captions or ball scores on the TV, cloudy vision, glare problems OS. This is negatively affecting the patient's quality of life and the patient is unable to function adequately in life with the current level of vision. Patient would like to proceed with cataract sx OS.  Medical History: Dry Eyes Glaucoma Cataracts High Blood Pressure LDL Stroke acid reflux, migraines, enlarged prostate  Review of Systems Cardiovascular High Blood Pressure All recorded systems are negative except as noted above.  Social Former smoker of Cigarettes   Medication Latanoprost, Prednisolone-Moxifloxacin-Bromfenac,  BP pill, Cholesterol pill, Potassium chloride, Tamsulosin, Thyroid pill  Sx/Procedures Phaco c IOL OD,  Appendectomy, Thyroidectomy  Drug Allergies  NKDA  History & Physical: Heent: PCL OD, cataract OS NECK: supple without bruits LUNGS: lungs clear to auscultation CV: regular rate and rhythm Abdomen: soft and non-tender  Impression & Plan: Assessment: 1.  CATARACT EXTRACTION STATUS; Right Eye (Z98.41) 2.  INTRAOCULAR LENS IOL ; Right Eye (Z96.1) 3.  CATARACT AGE-RELATED NUCLEAR; Both Eyes (H25.13)  Plan: 1.  POD4. Doing well. All post-op precautions discussed and instructions reviewed. Written instructions given.  Discussed continuing Latanoprost QHS OU as long term medication  2. See above  3.  Cataracts are visually significant and account for the patient's  complaints. Discussed all risks, benefits, procedures and recovery, including infection, loss of vision and eye, need for glasses after surgery or additional procedures. Patient understands changing glasses will not improve vision. Patient indicated understanding of procedure. All questions answered. Patient desires to have surgery, recommend phacoemulsification with intraocular lens. Patient to have preliminary testing necessary ([employer]os/IOL Master, Mac OCT, TOPO) Educational materials provided:Cataract.  Plan: - Proceed with surgery OS - RayOne lens - on Flomax, moderate pupil size, use Omidria - ok with lying flat, no prior eye surgery - patient declined MIGS

## 2023-02-03 ENCOUNTER — Encounter (HOSPITAL_COMMUNITY): Payer: Self-pay | Admitting: Optometry

## 2023-02-04 DIAGNOSIS — H2512 Age-related nuclear cataract, left eye: Secondary | ICD-10-CM | POA: Diagnosis not present

## 2023-02-08 ENCOUNTER — Encounter (HOSPITAL_COMMUNITY)
Admission: RE | Admit: 2023-02-08 | Discharge: 2023-02-08 | Disposition: A | Discharge status: Home / Self Care | Payer: Medicare Other | Source: Ambulatory Visit | Attending: Optometry | Admitting: Optometry

## 2023-02-11 ENCOUNTER — Encounter (HOSPITAL_COMMUNITY): Payer: Self-pay | Admitting: Optometry

## 2023-02-11 ENCOUNTER — Ambulatory Visit (HOSPITAL_COMMUNITY): Payer: Medicare Other | Admitting: Anesthesiology

## 2023-02-11 ENCOUNTER — Ambulatory Visit (HOSPITAL_COMMUNITY)
Admission: RE | Admit: 2023-02-11 | Discharge: 2023-02-11 | Disposition: A | Discharge status: Home / Self Care | Payer: Medicare Other | Attending: Optometry | Admitting: Optometry

## 2023-02-11 ENCOUNTER — Encounter (HOSPITAL_COMMUNITY)
Admission: RE | Disposition: A | Discharge status: Home / Self Care | Payer: Self-pay | Source: Home / Self Care | Attending: Optometry

## 2023-02-11 DIAGNOSIS — I10 Essential (primary) hypertension: Secondary | ICD-10-CM | POA: Insufficient documentation

## 2023-02-11 DIAGNOSIS — H2512 Age-related nuclear cataract, left eye: Secondary | ICD-10-CM | POA: Insufficient documentation

## 2023-02-11 DIAGNOSIS — Z09 Encounter for follow-up examination after completed treatment for conditions other than malignant neoplasm: Secondary | ICD-10-CM | POA: Insufficient documentation

## 2023-02-11 DIAGNOSIS — Z79899 Other long term (current) drug therapy: Secondary | ICD-10-CM | POA: Diagnosis not present

## 2023-02-11 DIAGNOSIS — K219 Gastro-esophageal reflux disease without esophagitis: Secondary | ICD-10-CM | POA: Insufficient documentation

## 2023-02-11 DIAGNOSIS — E039 Hypothyroidism, unspecified: Secondary | ICD-10-CM

## 2023-02-11 DIAGNOSIS — Z87891 Personal history of nicotine dependence: Secondary | ICD-10-CM | POA: Insufficient documentation

## 2023-02-11 HISTORY — PX: CATARACT EXTRACTION W/PHACO: SHX586

## 2023-02-11 SURGERY — PHACOEMULSIFICATION, CATARACT, WITH IOL INSERTION
Anesthesia: Monitor Anesthesia Care | Site: Eye | Laterality: Left

## 2023-02-11 MED ORDER — PHENYLEPHRINE-KETOROLAC 1-0.3 % IO SOLN
INTRAOCULAR | Status: AC
  Filled 2023-02-11: qty 4

## 2023-02-11 MED ORDER — NEOMYCIN-POLYMYXIN-DEXAMETH 3.5-10000-0.1 OP SUSP
OPHTHALMIC | Status: DC | PRN
  Administered 2023-02-11: 2 [drp] via OPHTHALMIC

## 2023-02-11 MED ORDER — PHENYLEPHRINE HCL 2.5 % OP SOLN
1.0000 [drp] | OPHTHALMIC | Status: AC | PRN
  Administered 2023-02-11 (×3): 1 [drp] via OPHTHALMIC

## 2023-02-11 MED ORDER — LIDOCAINE HCL (PF) 1 % IJ SOLN
INTRAMUSCULAR | Status: DC | PRN
  Administered 2023-02-11: 1 mL

## 2023-02-11 MED ORDER — PHENYLEPHRINE-KETOROLAC 1-0.3 % IO SOLN
INTRAOCULAR | Status: DC | PRN
  Administered 2023-02-11: 500 mL via OPHTHALMIC

## 2023-02-11 MED ORDER — LACTATED RINGERS IV SOLN: INTRAVENOUS | Status: DC

## 2023-02-11 MED ORDER — SIGHTPATH DOSE#1 NA HYALUR & NA CHOND-NA HYALUR IO KIT
PACK | INTRAOCULAR | Status: DC | PRN
  Administered 2023-02-11: 1 via OPHTHALMIC

## 2023-02-11 MED ORDER — LIDOCAINE HCL 3.5 % OP GEL
1.0000 | Freq: Once | OPHTHALMIC | Status: AC
  Administered 2023-02-11: 1 via OPHTHALMIC

## 2023-02-11 MED ORDER — STERILE WATER FOR IRRIGATION IR SOLN
Status: DC | PRN
  Administered 2023-02-11: 250 mL

## 2023-02-11 MED ORDER — MIDAZOLAM HCL 2 MG/2ML IJ SOLN
INTRAMUSCULAR | Status: AC
  Filled 2023-02-11: qty 2

## 2023-02-11 MED ORDER — MIDAZOLAM HCL 5 MG/5ML IJ SOLN
INTRAMUSCULAR | Status: DC | PRN
  Administered 2023-02-11: 2 mg via INTRAVENOUS

## 2023-02-11 MED ORDER — BSS IO SOLN
INTRAOCULAR | Status: DC | PRN
  Administered 2023-02-11: 15 mL via INTRAOCULAR

## 2023-02-11 MED ORDER — TROPICAMIDE 1 % OP SOLN
1.0000 [drp] | OPHTHALMIC | Status: AC | PRN
  Administered 2023-02-11 (×3): 1 [drp] via OPHTHALMIC

## 2023-02-11 MED ORDER — TETRACAINE HCL 0.5 % OP SOLN
1.0000 [drp] | OPHTHALMIC | Status: AC | PRN
  Administered 2023-02-11 (×3): 1 [drp] via OPHTHALMIC

## 2023-02-11 MED ORDER — POVIDONE-IODINE 5 % OP SOLN
OPHTHALMIC | Status: DC | PRN
  Administered 2023-02-11: 1 via OPHTHALMIC

## 2023-02-11 SURGICAL SUPPLY — 15 items
CATARACT SUITE SIGHTPATH (MISCELLANEOUS) ×1 IMPLANT
CLOTH BEACON ORANGE TIMEOUT ST (SAFETY) ×1 IMPLANT
DRSG TEGADERM 4X4.75 (GAUZE/BANDAGES/DRESSINGS) ×1 IMPLANT
EYE SHIELD UNIVERSAL CLEAR (GAUZE/BANDAGES/DRESSINGS) IMPLANT
FEE CATARACT SUITE SIGHTPATH (MISCELLANEOUS) ×1 IMPLANT
GLOVE BIOGEL PI IND STRL 7.0 (GLOVE) ×2 IMPLANT
LENS IOL RAYNER 23.0 (Intraocular Lens) ×1 IMPLANT
LENS IOL RAYONE EMV 23.0 (Intraocular Lens) IMPLANT
NDL HYPO 18GX1.5 BLUNT FILL (NEEDLE) ×1 IMPLANT
NEEDLE HYPO 18GX1.5 BLUNT FILL (NEEDLE) ×1 IMPLANT
PAD ARMBOARD 7.5X6 YLW CONV (MISCELLANEOUS) ×1 IMPLANT
RING MALYGIN 7.0 (MISCELLANEOUS) IMPLANT
SYR TB 1ML LL NO SAFETY (SYRINGE) ×1 IMPLANT
TAPE SURG TRANSPORE 1 IN (GAUZE/BANDAGES/DRESSINGS) IMPLANT
WATER STERILE IRR 250ML POUR (IV SOLUTION) ×1 IMPLANT

## 2023-02-11 NOTE — Anesthesia Postprocedure Evaluation (Signed)
Anesthesia Post Note  Patient: Hector Welch.  Procedure(s) Performed: CATARACT EXTRACTION PHACO AND INTRAOCULAR LENS PLACEMENT (IOC) (Left: Eye)  Patient location during evaluation: Phase II Anesthesia Type: MAC Level of consciousness: awake and alert and oriented Pain management: pain level controlled Vital Signs Assessment: post-procedure vital signs reviewed and stable Respiratory status: spontaneous breathing, nonlabored ventilation and respiratory function stable Cardiovascular status: stable and blood pressure returned to baseline Postop Assessment: no apparent nausea or vomiting Anesthetic complications: no  No notable events documented.   Last Vitals:  Vitals:   02/11/23 0934 02/11/23 1044  BP: 108/75 128/79  Pulse: (!) 51 (!) 50  Resp: 16 16  Temp: 36.5 C (!) 36.3 C  SpO2: 100% 100%    Last Pain:  Vitals:   02/11/23 1044  TempSrc: Oral  PainSc: 0-No pain                 Prajna Vanderpool C Wyatt Galvan

## 2023-02-11 NOTE — Transfer of Care (Signed)
Immediate Anesthesia Transfer of Care Note  Patient: Hector Welch.  Procedure(s) Performed: CATARACT EXTRACTION PHACO AND INTRAOCULAR LENS PLACEMENT (IOC) (Left: Eye)  Patient Location: PACU  Anesthesia Type:MAC  Level of Consciousness: awake, alert , oriented, and patient cooperative  Airway & Oxygen Therapy: Patient Spontanous Breathing  Post-op Assessment: Report given to RN, Post -op Vital signs reviewed and stable, and Patient moving all extremities X 4  Post vital signs: Reviewed and stable  Last Vitals:  Vitals Value Taken Time  BP 128/79   Temp 97.4   Pulse 52   Resp 18   SpO2 100     Last Pain:  Vitals:   02/11/23 0934  TempSrc: Oral  PainSc: 0-No pain         Complications: No notable events documented.

## 2023-02-11 NOTE — Op Note (Signed)
Date of procedure: 02/11/23  Pre-operative diagnosis: Visually significant age-related nuclear cataract, Left Eye (H25.12)  Post-operative diagnosis: Visually significant age-related nuclear cataract, Left Eye  Procedure: Removal of cataract via phacoemulsification and insertion of intra-ocular lens Rayner RAO200E +23.0D into the capsular bag of the Left Eye  Attending surgeon: Ronal Fear, MD  Anesthesia: MAC, Topical Akten  Complications: None  Estimated Blood Loss: <11mL (minimal)  Specimens: None  Implants:  Implant Name Type Inv. Item Serial No. Manufacturer Lot No. LRB No. Used Action  LENS IOL RAYNER 23.0 - [id number] Intraocular Lens LENS IOL RAYNER 23.0  [employer] [id number] Left 1 Implanted    Indications:  Visually significant age-related cataract, Left Eye  Procedure:  The patient was seen and identified in the pre-operative area. The operative eye was identified and dilated.  The operative eye was marked.  Topical anesthesia was administered to the operative eye.     The patient was then to the operative suite and placed in the supine position.  A timeout was performed confirming the patient, procedure to be performed, and all other relevant information.   The patient's face was prepped and draped in the usual fashion for intra-ocular surgery.  A lid speculum was placed into the operative eye and the surgical microscope moved into place and focused.  An inferotemporal paracentesis was created using a 20 gauge paracentesis blade.  BSS mixed with Omidria, followed by 1% lidocaine was injected into the anterior chamber.  Viscoelastic was injected into the anterior chamber.  A temporal clear-corneal main wound incision was created using a 2.37mm microkeratome.  A continuous curvilinear capsulorrhexis was initiated using an irrigating cystitome and completed using capsulorrhexis forceps.  Hydrodissection and hydrodeliniation were performed.  Viscoelastic was injected into the  anterior chamber.  A phacoemulsification handpiece and a chopper as a second instrument were used to remove the nucleus and epinucleus. The irrigation/aspiration handpiece was used to remove any remaining cortical material.   The capsular bag was reinflated with viscoelastic, checked, and found to be intact.  The intraocular lens was inserted into the capsular bag.  The irrigation/aspiration handpiece was used to remove any remaining viscoelastic.  The clear corneal wound and paracentesis wounds were then hydrated and checked with Weck-Cels to be watertight.  The lid-speculum and drape was removed, and the patient's face was cleaned with a wet and dry 4x4.  Moxifloxacin was instilled onto the eye. A clear shield was taped over the eye. The patient was taken to the post-operative care unit in good condition, having tolerated the procedure well.  Post-Op Instructions: The patient will follow up at [facility] for a same day post-operative evaluation and will receive all other orders and instructions.

## 2023-02-11 NOTE — Discharge Instructions (Signed)
Please discharge patient when stable, will follow up today with Dr. Day Greb at the [facility] [place] office immediately following discharge.  Leave shield in place until visit.  All paperwork with discharge instructions will be given at the office.  [facility] [place] Address:  [address]  [place], [place] [address]  Dr. Ichelle Harral's Phone: [phone]  

## 2023-02-11 NOTE — Anesthesia Preprocedure Evaluation (Signed)
Anesthesia Evaluation  Patient identified by MRN, date of birth, ID band Patient awake    Reviewed: Allergy & Precautions, H&P , NPO status , Patient's Chart, lab work & pertinent test results, reviewed documented beta blocker date and time   Airway Mallampati: II  TM Distance: >3 FB Neck ROM: Full    Dental  (+) Dental Advisory Given, Upper Dentures, Lower Dentures   Pulmonary neg pulmonary ROS, former smoker   Pulmonary exam normal breath sounds clear to auscultation       Cardiovascular hypertension, Pt. on medications and Pt. on home beta blockers Normal cardiovascular exam Rhythm:Regular Rate:Normal     Neuro/Psych negative neurological ROS  negative psych ROS   GI/Hepatic Neg liver ROS,GERD  Medicated,,  Endo/Other  Hypothyroidism    Renal/GU negative Renal ROS  negative genitourinary   Musculoskeletal  (+) Arthritis , Osteoarthritis,    Abdominal   Peds negative pediatric ROS (+)  Hematology negative hematology ROS (+)   Anesthesia Other Findings   Reproductive/Obstetrics negative OB ROS                             Anesthesia Physical Anesthesia Plan  ASA: 2  Anesthesia Plan: MAC   Post-op Pain Management: Minimal or no pain anticipated   Induction: Intravenous  PONV Risk Score and Plan: Treatment may vary due to age or medical condition  Airway Management Planned: Nasal Cannula and Natural Airway  Additional Equipment:   Intra-op Plan:   Post-operative Plan:   Informed Consent: I have reviewed the patients History and Physical, chart, labs and discussed the procedure including the risks, benefits and alternatives for the proposed anesthesia with the patient or authorized representative who has indicated his/her understanding and acceptance.     Dental advisory given  Plan Discussed with: CRNA  Anesthesia Plan Comments:        Anesthesia Quick  Evaluation

## 2023-02-11 NOTE — Interval H&P Note (Signed)
History and Physical Interval Note:  02/11/2023 9:49 AM  The H and P was reviewed and updated. The patient was examined.  No changes were found after exam.  The surgical eye was marked.  Hector Welch

## 2023-02-16 ENCOUNTER — Encounter (HOSPITAL_COMMUNITY): Payer: Self-pay | Admitting: Optometry

## 2023-02-21 DIAGNOSIS — E039 Hypothyroidism, unspecified: Secondary | ICD-10-CM | POA: Diagnosis not present

## 2023-02-21 DIAGNOSIS — Z23 Encounter for immunization: Secondary | ICD-10-CM | POA: Diagnosis not present

## 2023-02-21 DIAGNOSIS — K2100 Gastro-esophageal reflux disease with esophagitis, without bleeding: Secondary | ICD-10-CM | POA: Diagnosis not present

## 2023-02-21 DIAGNOSIS — I10 Essential (primary) hypertension: Secondary | ICD-10-CM | POA: Diagnosis not present

## 2023-02-21 DIAGNOSIS — R972 Elevated prostate specific antigen [PSA]: Secondary | ICD-10-CM | POA: Diagnosis not present

## 2023-02-21 DIAGNOSIS — N400 Enlarged prostate without lower urinary tract symptoms: Secondary | ICD-10-CM | POA: Diagnosis not present

## 2023-02-24 DIAGNOSIS — E785 Hyperlipidemia, unspecified: Secondary | ICD-10-CM | POA: Diagnosis not present

## 2023-02-24 DIAGNOSIS — I10 Essential (primary) hypertension: Secondary | ICD-10-CM | POA: Diagnosis not present

## 2023-02-24 DIAGNOSIS — E038 Other specified hypothyroidism: Secondary | ICD-10-CM | POA: Diagnosis not present

## 2023-02-24 DIAGNOSIS — N400 Enlarged prostate without lower urinary tract symptoms: Secondary | ICD-10-CM | POA: Diagnosis not present

## 2023-03-17 ENCOUNTER — Ambulatory Visit (INDEPENDENT_AMBULATORY_CARE_PROVIDER_SITE_OTHER): Payer: Medicare Other | Admitting: Urology

## 2023-03-17 ENCOUNTER — Encounter: Payer: Self-pay | Admitting: Urology

## 2023-03-17 VITALS — BP 124/78 | HR 71

## 2023-03-17 DIAGNOSIS — R972 Elevated prostate specific antigen [PSA]: Secondary | ICD-10-CM

## 2023-03-17 DIAGNOSIS — R351 Nocturia: Secondary | ICD-10-CM | POA: Diagnosis not present

## 2023-03-17 DIAGNOSIS — R3915 Urgency of urination: Secondary | ICD-10-CM | POA: Diagnosis not present

## 2023-03-17 DIAGNOSIS — N529 Male erectile dysfunction, unspecified: Secondary | ICD-10-CM

## 2023-03-17 DIAGNOSIS — R339 Retention of urine, unspecified: Secondary | ICD-10-CM

## 2023-03-17 DIAGNOSIS — N138 Other obstructive and reflux uropathy: Secondary | ICD-10-CM

## 2023-03-17 DIAGNOSIS — Z87440 Personal history of urinary (tract) infections: Secondary | ICD-10-CM

## 2023-03-17 DIAGNOSIS — N401 Enlarged prostate with lower urinary tract symptoms: Secondary | ICD-10-CM

## 2023-03-17 LAB — URINALYSIS, ROUTINE W REFLEX MICROSCOPIC
Bilirubin, UA: NEGATIVE
Glucose, UA: NEGATIVE
Ketones, UA: NEGATIVE
Leukocytes,UA: NEGATIVE
Nitrite, UA: NEGATIVE
Protein,UA: NEGATIVE
RBC, UA: NEGATIVE
Specific Gravity, UA: 1.015 (ref 1.005–1.030)
Urobilinogen, Ur: 1 mg/dL (ref 0.2–1.0)
pH, UA: 6 (ref 5.0–7.5)

## 2023-03-17 MED ORDER — FINASTERIDE 5 MG PO TABS: 5.0000 mg | ORAL_TABLET | Freq: Every day | ORAL | 3 refills | Status: DC

## 2023-03-17 MED ORDER — TAMSULOSIN HCL 0.4 MG PO CAPS
0.4000 mg | ORAL_CAPSULE | Freq: Every day | ORAL | 3 refills | Status: DC
Start: 2023-03-17 — End: 2023-09-22

## 2023-03-17 NOTE — Progress Notes (Signed)
Subjective: 1. BPH with urinary obstruction   2. Incomplete bladder emptying   3. Nocturia   4. Personal history of urinary infection   5. Elevated PSA   6. Urgency of urination    03/17/23: Chai returns today in f/u.  He has not had UTI symptoms in the last 6 months. He remains on tamsulosin and finasteride. His IPSS is 5 with urgency and nocturia x 2.  His PVR is stable at .  He has persistent ED.   He has tried the sildenafil but didn't have success.   He had cataract surgery in the last couple of months.   His UA is clear.   09/16/22: Zubayr returns today in f/u.  He was taken off of Keflex suppression earlier this month and is doing well without symptoms of recurrent UTI.  He remains on tamsulosin and finasteride. His PSA has continued to fall and is down to 2.9 with the finasteride.  He continues to have issues with ED and hasn't tried any treatments. He has sporadic function.   06/10/22: Eldridge returns today in f/u.  He remains on tamsulosin and finasteride.  HIs IPSS is 7 and his PVR is .  He has no dysuria or hematuria.  His UA has >30 WBC and bacteria.   He has no associated signs or symptoms.  His PSA is back up to 4.8 with a f/t ratio of 17.7 from 3.9 despite the finasteride but his urine looks infected again.    03/04/22: Celvin returns today in f/u. He remains on tamsulosin and finasteride and is doing well with an IPSS of 12.  He has nocturia x 2.  His PVR is .  His PSA is down from 7.6 to 3.9 on finasteride.   He has had a prior UTI but no symptoms suggestive of a recurrence but his UA has 6-10 WBC and many bacteria.    11/26/21: Renny returns today in f/u for cystoscopy for further evaluation of his LUTS and UTI history.  He has some frequency and urgency but can hold it better following treatment of the UTI.  He has  a reduced stream with some terminal dribbling.   He remains on tamsulosin 0.4mg  daily.   PVR today is >290ml.  PF on flowrate was 22ml/sec but that appeared to be  an artifact.  The MF was 22ml/sec with voided.    10/08/21: Mr. Sullo returns today in f/u.   His PSA is up to 7.6 prior to this visit.  He has a reduced flow recent and has increased nocturia with small voids.  He has no dysuria or hematuria.  He has some malodorous urine and he had a low grade fever a few days ago.  He had an e. Coli UTI in June.  He has a refill on the bactrim but hasn't started it yet.  He has some tightness in the right calf without tenderness.  He remains on tamsulosin.  His UA has 2+ LE.  His prior PVR was with a PF of 66ml/sec on a flowrate in 7/22.      07/09/21: Mr. Sipos is a 77 yo male who is sent by Dr. Felecia Shelling for an elevated PSA of 4.38 in May.   His PSA is 4.6 with a 20.4% f/t ratio on repeat prior to this visit and in 7/20 it was 3.3.   He has moderate LUTS with urgency, nocturia 2x and frequency.  He had e. Coli on his culture in July and  completed the bactrim.  His LUTS are improved.  He has no dysuria or hematuira.  His IPSS is 10-11   IPSS     Row Name 03/17/23 1100         International Prostate Symptom Score   How often have you had the sensation of not emptying your bladder? Not at All     How often have you had to urinate less than every two hours? Less than 1 in 5 times     How often have you found you stopped and started again several times when you urinated? Not at All     How often have you found it difficult to postpone urination? Less than half the time     How often have you had a weak urinary stream? Not at All     How often have you had to strain to start urination? Not at All     How many times did you typically get up at night to urinate? 2 Times     Total IPSS Score 5       Quality of Life due to urinary symptoms   If you were to spend the rest of your life with your urinary condition just the way it is now how would you feel about that? Pleased                ROS:  Review of Systems  Eyes:  Positive for blurred  vision.  All other systems reviewed and are negative.   No Known Allergies  Past Medical History:  Diagnosis Date   Astigmatism    BPH (benign prostatic hyperplasia)    Cataracts, bilateral    Cervicalgia    Cervicalgia    Chest pain, unspecified    Essential (primary) hypertension    Gastro-esophageal reflux disease with esophagitis    GERD (gastroesophageal reflux disease)    Glaucoma    Hyperlipidemia, unspecified    Hypertension    Mini stroke 12/2017   Nontoxic multinodular goiter    Osteoarthritis    Other obstructive and reflux uropathy    Pain in joint involving shoulder region    Pain in right ankle and joints of right foot    Thyroid goiter    Transient cerebral ischemic attack, unspecified    Unspecified glaucoma     Past Surgical History:  Procedure Laterality Date   APPENDECTOMY  APH, 60's   CATARACT EXTRACTION W/PHACO Right 01/28/2023   Procedure: CATARACT EXTRACTION PHACO AND INTRAOCULAR LENS PLACEMENT (IOC);  Surgeon: Pecolia Ades, MD;  Location: [facility];  Service: Ophthalmology;  Laterality: Right;  CDE: 7.66   CATARACT EXTRACTION W/PHACO Left 02/11/2023   Procedure: CATARACT EXTRACTION PHACO AND INTRAOCULAR LENS PLACEMENT (IOC);  Surgeon: Pecolia Ades, MD;  Location: [facility];  Service: Ophthalmology;  Laterality: Left;  CDE: 6.69   COLONOSCOPY N/A 12/05/2012   Procedure: COLONOSCOPY;  Surgeon: Corbin Ade, MD;  Location: [facility];  Service: Endoscopy;  Laterality: N/A;  1:00 PM   DENTAL SURGERY     THYROIDECTOMY  07/17/2018   THYROIDECTOMY N/A 07/17/2018   Procedure: TOTAL THYROIDECTOMY;  Surgeon: Darnell Level, MD;  Location: [facility];  Service: General;  Laterality: N/A;    Social History   Socioeconomic History   Marital status: Married    Spouse name: Not on file   Number of children: 4   Years of education: 12   Highest education level: Not on file  Occupational History   Occupation: Retired  Tobacco Use   Smoking status: Former     Packs/day: 0.50    Years: 3.00    Additional pack years: 0.00    Total pack years: 1.50    Types: Cigarettes    Quit date: 1999    Years since quitting: 25.4   Smokeless tobacco: Never   Tobacco comments:    was a light smoker  Vaping Use   Vaping Use: Never used  Substance and Sexual Activity   Alcohol use: No   Drug use: No   Sexual activity: Not Currently  Other Topics Concern   Not on file  Social History Narrative   Lives w/ wife   Caffeine use: Coffee daily   Right handed    Social Determinants of Health   Financial Resource Strain: Not on file  Food Insecurity: Not on file  Transportation Needs: Not on file  Physical Activity: Not on file  Stress: Not on file  Social Connections: Not on file  Intimate Partner Violence: Not on file    Family History  Problem Relation Age of Onset   Cancer Mother    Cancer Father    Cancer Sister    Cancer Brother     Anti-infectives: Anti-infectives (From admission, onward)    None       Current Outpatient Medications  Medication Sig Dispense Refill   atenolol (TENORMIN) 50 MG tablet Take 50 mg by mouth daily.     atorvastatin (LIPITOR) 20 MG tablet Take 20 mg by mouth daily.     levothyroxine (SYNTHROID) 75 MCG tablet Take 75 mcg by mouth every morning.     losartan (COZAAR) 100 MG tablet Take 100 mg by mouth daily.     nebivolol (BYSTOLIC) 10 MG tablet Take 10 mg by mouth at bedtime.     sildenafil (REVATIO) 20 MG tablet Take 1-5 tablets po daily as needed (start with 2-3) 30 tablet 11   finasteride (PROSCAR) 5 MG tablet Take 1 tablet (5 mg total) by mouth daily. 100 tablet 3   tamsulosin (FLOMAX) 0.4 MG CAPS capsule Take 1 capsule (0.4 mg total) by mouth daily. 100 capsule 3   No current facility-administered medications for this visit.     Objective: Vital signs in last 24 hours: BP 124/78   Pulse 71   Intake/Output from previous day: No intake/output data recorded. Intake/Output this  shift: @IOTHISSHIFT @   Physical Exam Vitals reviewed.  Constitutional:      Appearance: Normal appearance.  Neurological:     Mental Status: He is alert.     Lab Results:  Results for orders placed or performed in visit on 03/17/23 (from the past 24 hour(s))  Urinalysis, Routine w reflex microscopic     Status: None   Collection Time: 03/17/23 11:10 AM  Result Value Ref Range   Specific Gravity, UA 1.015 1.005 - 1.030   pH, UA 6.0 5.0 - 7.5   Color, UA Yellow Yellow   Appearance Ur Clear Clear   Leukocytes,UA Negative Negative   Protein,UA Negative Negative/Trace   Glucose, UA Negative Negative   Ketones, UA Negative Negative   RBC, UA Negative Negative   Bilirubin, UA Negative Negative   Urobilinogen, Ur 1.0 0.2 - 1.0 mg/dL   Nitrite, UA Negative Negative   Microscopic Examination Comment    Narrative   Performed at:  [address] - [facility] [address] [place], [place]  [id number] Lab Director: Chinita Pester MT, Phone:  [phone]      UA  has >30 WBC and many bacteria.     BMET No results for input(s): "NA", "K", "CL", "CO2", "GLUCOSE", "BUN", "CREATININE", "CALCIUM" in the last 72 hours. PT/INR No results for input(s): "LABPROT", "INR" in the last 72 hours. ABG No results for input(s): "PHART", "HCO3" in the last 72 hours.  Invalid input(s): "PCO2", "PO2" UA clear Studies/Results: No results found.   Assessment/Plan: Elevated PSA with recent UTI.   His PSA was down to 2.9 and his UA is clear.   BPH with BOO.  He is voiding better with the addition of finasteride to the tamsulosin and will continue that.  HIs PVR is stable at .     Urethral stricture.  Mild/moderate 3cm bulbar stricture was found on cystoscopy.  He has a good stream.  ED.  Sildenafil was not effective.   Meds ordered this encounter  Medications   finasteride (PROSCAR) 5 MG tablet    Sig: Take 1 tablet (5 mg total) by mouth daily.    Dispense:  100 tablet    Refill:   3   tamsulosin (FLOMAX) 0.4 MG CAPS capsule    Sig: Take 1 capsule (0.4 mg total) by mouth daily.    Dispense:  100 capsule    Refill:  3       Orders Placed This Encounter  Procedures   Urinalysis, Routine w reflex microscopic   PSA, total and free    Standing Status:   Future    Standing Expiration Date:   03/16/2024   BLADDER SCAN AMB NON-IMAGING      Return for 6 months with PSA. .    CC: Avon Gully MD.     Vardell Rodocker 03/18/2023  Patient ID: [name], male   DOB: [birth year], 77 y.o.   MRN: [id number]

## 2023-03-27 DIAGNOSIS — E038 Other specified hypothyroidism: Secondary | ICD-10-CM | POA: Diagnosis not present

## 2023-03-27 DIAGNOSIS — I10 Essential (primary) hypertension: Secondary | ICD-10-CM | POA: Diagnosis not present

## 2023-04-26 DIAGNOSIS — E038 Other specified hypothyroidism: Secondary | ICD-10-CM | POA: Diagnosis not present

## 2023-04-26 DIAGNOSIS — I10 Essential (primary) hypertension: Secondary | ICD-10-CM | POA: Diagnosis not present

## 2023-05-27 DIAGNOSIS — I10 Essential (primary) hypertension: Secondary | ICD-10-CM | POA: Diagnosis not present

## 2023-05-27 DIAGNOSIS — E038 Other specified hypothyroidism: Secondary | ICD-10-CM | POA: Diagnosis not present

## 2023-06-27 DIAGNOSIS — I10 Essential (primary) hypertension: Secondary | ICD-10-CM | POA: Diagnosis not present

## 2023-06-27 DIAGNOSIS — E038 Other specified hypothyroidism: Secondary | ICD-10-CM | POA: Diagnosis not present

## 2023-07-13 DIAGNOSIS — H401132 Primary open-angle glaucoma, bilateral, moderate stage: Secondary | ICD-10-CM | POA: Diagnosis not present

## 2023-07-27 DIAGNOSIS — E038 Other specified hypothyroidism: Secondary | ICD-10-CM | POA: Diagnosis not present

## 2023-07-27 DIAGNOSIS — I10 Essential (primary) hypertension: Secondary | ICD-10-CM | POA: Diagnosis not present

## 2023-08-18 DIAGNOSIS — K2100 Gastro-esophageal reflux disease with esophagitis, without bleeding: Secondary | ICD-10-CM | POA: Diagnosis not present

## 2023-08-18 DIAGNOSIS — E038 Other specified hypothyroidism: Secondary | ICD-10-CM | POA: Diagnosis not present

## 2023-08-18 DIAGNOSIS — I10 Essential (primary) hypertension: Secondary | ICD-10-CM | POA: Diagnosis not present

## 2023-08-18 DIAGNOSIS — E785 Hyperlipidemia, unspecified: Secondary | ICD-10-CM | POA: Diagnosis not present

## 2023-08-24 DIAGNOSIS — Z1331 Encounter for screening for depression: Secondary | ICD-10-CM | POA: Diagnosis not present

## 2023-08-24 DIAGNOSIS — I10 Essential (primary) hypertension: Secondary | ICD-10-CM | POA: Diagnosis not present

## 2023-08-24 DIAGNOSIS — E785 Hyperlipidemia, unspecified: Secondary | ICD-10-CM | POA: Diagnosis not present

## 2023-08-24 DIAGNOSIS — Z1389 Encounter for screening for other disorder: Secondary | ICD-10-CM | POA: Diagnosis not present

## 2023-08-24 DIAGNOSIS — Z23 Encounter for immunization: Secondary | ICD-10-CM | POA: Diagnosis not present

## 2023-08-24 DIAGNOSIS — N400 Enlarged prostate without lower urinary tract symptoms: Secondary | ICD-10-CM | POA: Diagnosis not present

## 2023-08-24 DIAGNOSIS — E038 Other specified hypothyroidism: Secondary | ICD-10-CM | POA: Diagnosis not present

## 2023-09-13 ENCOUNTER — Other Ambulatory Visit: Payer: Medicare Other

## 2023-09-13 DIAGNOSIS — N401 Enlarged prostate with lower urinary tract symptoms: Secondary | ICD-10-CM | POA: Diagnosis not present

## 2023-09-13 DIAGNOSIS — R972 Elevated prostate specific antigen [PSA]: Secondary | ICD-10-CM | POA: Diagnosis not present

## 2023-09-13 DIAGNOSIS — N138 Other obstructive and reflux uropathy: Secondary | ICD-10-CM

## 2023-09-14 LAB — PSA, TOTAL AND FREE
PSA, Free Pct: 18.1 %
PSA, Free: 0.49 ng/mL
Prostate Specific Ag, Serum: 2.7 ng/mL (ref 0.0–4.0)

## 2023-09-22 ENCOUNTER — Encounter: Payer: Self-pay | Admitting: Urology

## 2023-09-22 ENCOUNTER — Ambulatory Visit (INDEPENDENT_AMBULATORY_CARE_PROVIDER_SITE_OTHER): Payer: Medicare Other | Admitting: Urology

## 2023-09-22 VITALS — BP 138/84 | HR 65

## 2023-09-22 DIAGNOSIS — N401 Enlarged prostate with lower urinary tract symptoms: Secondary | ICD-10-CM

## 2023-09-22 DIAGNOSIS — Z87440 Personal history of urinary (tract) infections: Secondary | ICD-10-CM

## 2023-09-22 DIAGNOSIS — R351 Nocturia: Secondary | ICD-10-CM | POA: Diagnosis not present

## 2023-09-22 DIAGNOSIS — R339 Retention of urine, unspecified: Secondary | ICD-10-CM

## 2023-09-22 DIAGNOSIS — N5201 Erectile dysfunction due to arterial insufficiency: Secondary | ICD-10-CM

## 2023-09-22 DIAGNOSIS — R8271 Bacteriuria: Secondary | ICD-10-CM

## 2023-09-22 DIAGNOSIS — N138 Other obstructive and reflux uropathy: Secondary | ICD-10-CM | POA: Diagnosis not present

## 2023-09-22 DIAGNOSIS — R972 Elevated prostate specific antigen [PSA]: Secondary | ICD-10-CM

## 2023-09-22 MED ORDER — TAMSULOSIN HCL 0.4 MG PO CAPS: 0.4000 mg | ORAL_CAPSULE | Freq: Every day | ORAL | 3 refills | Status: DC

## 2023-09-22 MED ORDER — FINASTERIDE 5 MG PO TABS: 5.0000 mg | ORAL_TABLET | Freq: Every day | ORAL | 3 refills | Status: DC

## 2023-09-22 NOTE — Progress Notes (Signed)
Subjective: 1. BPH with urinary obstruction   2. Incomplete bladder emptying   3. Nocturia   4. Elevated PSA   5. Personal history of urinary infection   6. Bacteriuria   7. Erectile dysfunction due to arterial insufficiency    09/22/23: Porfirio returns today in f/u.   He remains on tamsulosin and finasteride and has had no furrther UTI's.  He has stable LUTS with an IPSS of 6 and nocturia x 2.  He has ED but is not interested in further therapy.  His PSA is down further to 2.7 on the finasteride.  His UA has many bacteria but no WBC's.   He has no UTI symptoms.   03/17/23: Dewane Timson returns today in f/u.  He has not had UTI symptoms in the last 6 months. He remains on tamsulosin and finasteride. His IPSS is 5 with urgency and nocturia x 2.  His PVR is stable at .  He has persistent ED.   He has tried the sildenafil but didn't have success.   He had cataract surgery in the last couple of months.   His UA is clear.   09/16/22: Kimiya Brunelle returns today in f/u.  He was taken off of Keflex suppression earlier this month and is doing well without symptoms of recurrent UTI.  He remains on tamsulosin and finasteride. His PSA has continued to fall and is down to 2.9 with the finasteride.  He continues to have issues with ED and hasn't tried any treatments. He has sporadic function.   06/10/22: Demarkis Gheen returns today in f/u.  He remains on tamsulosin and finasteride.  HIs IPSS is 7 and his PVR is .  He has no dysuria or hematuria.  His UA has >30 WBC and bacteria.   He has no associated signs or symptoms.  His PSA is back up to 4.8 with a f/t ratio of 17.7 from 3.9 despite the finasteride but his urine looks infected again.    03/04/22: Fawne Hughley returns today in f/u. He remains on tamsulosin and finasteride and is doing well with an IPSS of 12.  He has nocturia x 2.  His PVR is .  His PSA is down from 7.6 to 3.9 on finasteride.   He has had a prior UTI but no symptoms suggestive of a recurrence but his UA has 6-10  WBC and many bacteria.    11/26/21: Jason Frisbee returns today in f/u for cystoscopy for further evaluation of his LUTS and UTI history.  He has some frequency and urgency but can hold it better following treatment of the UTI.  He has  a reduced stream with some terminal dribbling.   He remains on tamsulosin 0.4mg  daily.   PVR today is >252ml.  PF on flowrate was 38ml/sec but that appeared to be an artifact.  The MF was 28ml/sec with voided.    10/08/21: Mr. Mindel returns today in f/u.   His PSA is up to 7.6 prior to this visit.  He has a reduced flow recent and has increased nocturia with small voids.  He has no dysuria or hematuria.  He has some malodorous urine and he had a low grade fever a few days ago.  He had an e. Coli UTI in June.  He has a refill on the bactrim but hasn't started it yet.  He has some tightness in the right calf without tenderness.  He remains on tamsulosin.  His UA has 2+ LE.  His prior PVR was with a  PF of 87ml/sec on a flowrate in 7/22.      07/09/21: Mr. Furnas is a 77 yo male who is sent by Dr. Felecia Shelling for an elevated PSA of 4.38 in May.   His PSA is 4.6 with a 20.4% f/t ratio on repeat prior to this visit and in 7/20 it was 3.3.   He has moderate LUTS with urgency, nocturia 2x and frequency.  He had e. Coli on his culture in July and completed the bactrim.  His LUTS are improved.  He has no dysuria or hematuira.  His IPSS is 10-11   IPSS     Row Name 09/22/23 1100         International Prostate Symptom Score   How often have you had the sensation of not emptying your bladder? Not at All     How often have you had to urinate less than every two hours? Less than half the time     How often have you found you stopped and started again several times when you urinated? Not at All     How often have you found it difficult to postpone urination? Less than half the time     How often have you had a weak urinary stream? Not at All     How often have you had to strain to  start urination? Not at All     How many times did you typically get up at night to urinate? 2 Times     Total IPSS Score 6       Quality of Life due to urinary symptoms   If you were to spend the rest of your life with your urinary condition just the way it is now how would you feel about that? Mostly Satisfied                 ROS:  Review of Systems  Musculoskeletal:  Positive for joint pain.  All other systems reviewed and are negative.   No Known Allergies  Past Medical History:  Diagnosis Date   Astigmatism    BPH (benign prostatic hyperplasia)    Cataracts, bilateral    Cervicalgia    Cervicalgia    Chest pain, unspecified    Essential (primary) hypertension    Gastro-esophageal reflux disease with esophagitis    GERD (gastroesophageal reflux disease)    Glaucoma    Hyperlipidemia, unspecified    Hypertension    Mini stroke 12/2017   Nontoxic multinodular goiter    Osteoarthritis    Other obstructive and reflux uropathy    Pain in joint involving shoulder region    Pain in right ankle and joints of right foot    Thyroid goiter    Transient cerebral ischemic attack, unspecified    Unspecified glaucoma     Past Surgical History:  Procedure Laterality Date   APPENDECTOMY  APH, 60's   CATARACT EXTRACTION W/PHACO Right 01/28/2023   Procedure: CATARACT EXTRACTION PHACO AND INTRAOCULAR LENS PLACEMENT (IOC);  Surgeon: Pecolia Ades, MD;  Location: [facility];  Service: Ophthalmology;  Laterality: Right;  CDE: 7.66   CATARACT EXTRACTION W/PHACO Left 02/11/2023   Procedure: CATARACT EXTRACTION PHACO AND INTRAOCULAR LENS PLACEMENT (IOC);  Surgeon: Pecolia Ades, MD;  Location: [facility];  Service: Ophthalmology;  Laterality: Left;  CDE: 6.69   COLONOSCOPY N/A 12/05/2012   Procedure: COLONOSCOPY;  Surgeon: Corbin Ade, MD;  Location: [facility];  Service: Endoscopy;  Laterality: N/A;  1:00 PM   DENTAL  SURGERY     THYROIDECTOMY  07/17/2018   THYROIDECTOMY N/A  07/17/2018   Procedure: TOTAL THYROIDECTOMY;  Surgeon: Darnell Level, MD;  Location: [facility];  Service: General;  Laterality: N/A;    Social History   Socioeconomic History   Marital status: Married    Spouse name: Not on file   Number of children: 4   Years of education: 12   Highest education level: Not on file  Occupational History   Occupation: Retired  Tobacco Use   Smoking status: Former    Current packs/day: 0.00    Average packs/day: 0.5 packs/day for 3.0 years (1.5 ttl pk-yrs)    Types: Cigarettes    Start date: 17    Quit date: 1999    Years since quitting: 25.9   Smokeless tobacco: Never   Tobacco comments:    was a light smoker  Vaping Use   Vaping status: Never Used  Substance and Sexual Activity   Alcohol use: No   Drug use: No   Sexual activity: Not Currently  Other Topics Concern   Not on file  Social History Narrative   Lives w/ wife   Caffeine use: Coffee daily   Right handed    Social Determinants of Health   Financial Resource Strain: Not on file  Food Insecurity: Not on file  Transportation Needs: Not on file  Physical Activity: Not on file  Stress: Not on file  Social Connections: Not on file  Intimate Partner Violence: Not on file    Family History  Problem Relation Age of Onset   Cancer Mother    Cancer Father    Cancer Sister    Cancer Brother     Anti-infectives: Anti-infectives (From admission, onward)    None       Current Outpatient Medications  Medication Sig Dispense Refill   atenolol (TENORMIN) 50 MG tablet Take 50 mg by mouth daily.     atorvastatin (LIPITOR) 20 MG tablet Take 20 mg by mouth daily.     levothyroxine (SYNTHROID) 75 MCG tablet Take 75 mcg by mouth every morning.     losartan (COZAAR) 100 MG tablet Take 100 mg by mouth daily.     finasteride (PROSCAR) 5 MG tablet Take 1 tablet (5 mg total) by mouth daily. 90 tablet 3   tamsulosin (FLOMAX) 0.4 MG CAPS capsule Take 1 capsule (0.4 mg total) by mouth  daily. 90 capsule 3   No current facility-administered medications for this visit.     Objective: Vital signs in last 24 hours: BP 138/84   Pulse 65   Intake/Output from previous day: No intake/output data recorded. Intake/Output this shift: @IOTHISSHIFT @   Physical Exam Vitals reviewed.  Constitutional:      Appearance: Normal appearance.  Neurological:     Mental Status: He is alert.     Lab Results:  No results found for this or any previous visit (from the past 24 hour(s)).  Lab Results  Component Value Date   PSA1 2.7 09/13/2023   PSA1 2.9 09/08/2022   PSA1 4.8 (H) 06/03/2022      UA has many bacteria.     BMET No results for input(s): "NA", "K", "CL", "CO2", "GLUCOSE", "BUN", "CREATININE", "CALCIUM" in the last 72 hours. PT/INR No results for input(s): "LABPROT", "INR" in the last 72 hours. ABG No results for input(s): "PHART", "HCO3" in the last 72 hours.  Invalid input(s): "PCO2", "PO2"  Studies/Results: No results found.   Assessment/Plan: Elevated PSA.  His  PSA remains suppressed with finasteride.  Repeat in  a year.   BPH with BOO.  He is voiding will on finasteride and tamsulosin which I have refilled.   History of UTI.  He has many bacteria today but no symptoms.  I will culture but not treat unless he develops symptoms.      Urethral stricture.  Mild/moderate 3cm bulbar stricture was found on cystoscopy.  He has a good stream.  ED.  Sildenafil was not effective.  He is not interested in further therapy.   Meds ordered this encounter  Medications   tamsulosin (FLOMAX) 0.4 MG CAPS capsule    Sig: Take 1 capsule (0.4 mg total) by mouth daily.    Dispense:  90 capsule    Refill:  3   finasteride (PROSCAR) 5 MG tablet    Sig: Take 1 tablet (5 mg total) by mouth daily.    Dispense:  90 tablet    Refill:  3       Orders Placed This Encounter  Procedures   Urine Culture   Microscopic Examination   Urinalysis, Routine w reflex  microscopic   PSA, total and free    Standing Status:   Future    Standing Expiration Date:   09/21/2024      Return in about 1 year (around 09/21/2024) for with PSA.    CC: Avon Gully MD.     Bjorn Pippin 09/25/2023  Patient ID: [name], male   DOB: Nov 21, 1945, 77 y.o.   MRN: [id number]

## 2023-09-23 DIAGNOSIS — E038 Other specified hypothyroidism: Secondary | ICD-10-CM | POA: Diagnosis not present

## 2023-09-23 DIAGNOSIS — I10 Essential (primary) hypertension: Secondary | ICD-10-CM | POA: Diagnosis not present

## 2023-09-23 LAB — URINALYSIS, ROUTINE W REFLEX MICROSCOPIC
Bilirubin, UA: NEGATIVE
Glucose, UA: NEGATIVE
Ketones, UA: NEGATIVE
Nitrite, UA: NEGATIVE
Protein,UA: NEGATIVE
RBC, UA: NEGATIVE
Specific Gravity, UA: 1.005 (ref 1.005–1.030)
Urobilinogen, Ur: 1 mg/dL (ref 0.2–1.0)
pH, UA: 6 (ref 5.0–7.5)

## 2023-09-23 LAB — MICROSCOPIC EXAMINATION: RBC, Urine: NONE SEEN /[HPF] (ref 0–2)

## 2023-09-27 ENCOUNTER — Telehealth: Payer: Self-pay

## 2023-09-27 LAB — URINE CULTURE

## 2023-09-27 NOTE — Telephone Encounter (Signed)
-----   Message from Bjorn Pippin sent at 09/27/2023 11:58 AM EST ----- Culture is positive but he had no symptoms so I didn't plan to treat. ----- Message ----- From: Sarajane Jews, CMA Sent: 09/27/2023   7:55 AM EST To: Bjorn Pippin, MD  Please review. Appt. 12/11

## 2023-09-27 NOTE — Telephone Encounter (Signed)
Called patient wife answered, and she was given results per MD

## 2023-10-24 DIAGNOSIS — E038 Other specified hypothyroidism: Secondary | ICD-10-CM | POA: Diagnosis not present

## 2023-10-24 DIAGNOSIS — I10 Essential (primary) hypertension: Secondary | ICD-10-CM | POA: Diagnosis not present

## 2023-11-16 DIAGNOSIS — H16223 Keratoconjunctivitis sicca, not specified as Sjogren's, bilateral: Secondary | ICD-10-CM | POA: Diagnosis not present

## 2023-11-16 DIAGNOSIS — H43811 Vitreous degeneration, right eye: Secondary | ICD-10-CM | POA: Diagnosis not present

## 2023-11-16 DIAGNOSIS — H401132 Primary open-angle glaucoma, bilateral, moderate stage: Secondary | ICD-10-CM | POA: Diagnosis not present

## 2023-11-24 DIAGNOSIS — E038 Other specified hypothyroidism: Secondary | ICD-10-CM | POA: Diagnosis not present

## 2023-11-24 DIAGNOSIS — I10 Essential (primary) hypertension: Secondary | ICD-10-CM | POA: Diagnosis not present

## 2023-12-22 DIAGNOSIS — E038 Other specified hypothyroidism: Secondary | ICD-10-CM | POA: Diagnosis not present

## 2023-12-22 DIAGNOSIS — I10 Essential (primary) hypertension: Secondary | ICD-10-CM | POA: Diagnosis not present

## 2024-01-22 DIAGNOSIS — I10 Essential (primary) hypertension: Secondary | ICD-10-CM | POA: Diagnosis not present

## 2024-01-22 DIAGNOSIS — E038 Other specified hypothyroidism: Secondary | ICD-10-CM | POA: Diagnosis not present

## 2024-02-20 DIAGNOSIS — I10 Essential (primary) hypertension: Secondary | ICD-10-CM | POA: Diagnosis not present

## 2024-02-20 DIAGNOSIS — R972 Elevated prostate specific antigen [PSA]: Secondary | ICD-10-CM | POA: Diagnosis not present

## 2024-02-20 DIAGNOSIS — E785 Hyperlipidemia, unspecified: Secondary | ICD-10-CM | POA: Diagnosis not present

## 2024-02-20 DIAGNOSIS — Z0001 Encounter for general adult medical examination with abnormal findings: Secondary | ICD-10-CM | POA: Diagnosis not present

## 2024-02-20 DIAGNOSIS — E038 Other specified hypothyroidism: Secondary | ICD-10-CM | POA: Diagnosis not present

## 2024-03-06 DIAGNOSIS — H16223 Keratoconjunctivitis sicca, not specified as Sjogren's, bilateral: Secondary | ICD-10-CM | POA: Diagnosis not present

## 2024-03-06 DIAGNOSIS — H401132 Primary open-angle glaucoma, bilateral, moderate stage: Secondary | ICD-10-CM | POA: Diagnosis not present

## 2024-03-06 DIAGNOSIS — H43811 Vitreous degeneration, right eye: Secondary | ICD-10-CM | POA: Diagnosis not present

## 2024-03-14 DIAGNOSIS — E785 Hyperlipidemia, unspecified: Secondary | ICD-10-CM | POA: Diagnosis not present

## 2024-03-14 DIAGNOSIS — I10 Essential (primary) hypertension: Secondary | ICD-10-CM | POA: Diagnosis not present

## 2024-03-14 DIAGNOSIS — E038 Other specified hypothyroidism: Secondary | ICD-10-CM | POA: Diagnosis not present

## 2024-04-14 DIAGNOSIS — I10 Essential (primary) hypertension: Secondary | ICD-10-CM | POA: Diagnosis not present

## 2024-04-14 DIAGNOSIS — E038 Other specified hypothyroidism: Secondary | ICD-10-CM | POA: Diagnosis not present

## 2024-05-14 DIAGNOSIS — E038 Other specified hypothyroidism: Secondary | ICD-10-CM | POA: Diagnosis not present

## 2024-05-14 DIAGNOSIS — I10 Essential (primary) hypertension: Secondary | ICD-10-CM | POA: Diagnosis not present

## 2024-06-01 ENCOUNTER — Other Ambulatory Visit: Payer: Self-pay

## 2024-06-01 DIAGNOSIS — N138 Other obstructive and reflux uropathy: Secondary | ICD-10-CM

## 2024-06-01 NOTE — Telephone Encounter (Signed)
 Patient's pharmacy cannot fill medication for 2 days.  finasteride  (PROSCAR ) 5 MG tablet   Asking if he will be ok to go that long without the medication?  Please advise.   Call:  [phone]

## 2024-06-01 NOTE — Telephone Encounter (Signed)
 Return called to patient making him aware a message will be sent to MD [place]. Patient state's his pharmacy contact him that the Rx is ready for pick up and will reach back out if their is any issues with medication.

## 2024-06-13 MED ORDER — FINASTERIDE 5 MG PO TABS: 5.0000 mg | ORAL_TABLET | Freq: Every day | ORAL | 1 refills | Status: DC

## 2024-06-13 NOTE — Telephone Encounter (Signed)
 Patient is made aware and voiced understanding OK to refill - #90 with 1 refill (we see him DEC 2026) - thanks

## 2024-06-14 DIAGNOSIS — I10 Essential (primary) hypertension: Secondary | ICD-10-CM | POA: Diagnosis not present

## 2024-06-14 DIAGNOSIS — E038 Other specified hypothyroidism: Secondary | ICD-10-CM | POA: Diagnosis not present

## 2024-07-15 DIAGNOSIS — I10 Essential (primary) hypertension: Secondary | ICD-10-CM | POA: Diagnosis not present

## 2024-07-15 DIAGNOSIS — E038 Other specified hypothyroidism: Secondary | ICD-10-CM | POA: Diagnosis not present

## 2024-08-14 DIAGNOSIS — I10 Essential (primary) hypertension: Secondary | ICD-10-CM | POA: Diagnosis not present

## 2024-08-14 DIAGNOSIS — E038 Other specified hypothyroidism: Secondary | ICD-10-CM | POA: Diagnosis not present

## 2024-08-31 DIAGNOSIS — K2100 Gastro-esophageal reflux disease with esophagitis, without bleeding: Secondary | ICD-10-CM | POA: Diagnosis not present

## 2024-08-31 DIAGNOSIS — R972 Elevated prostate specific antigen [PSA]: Secondary | ICD-10-CM | POA: Diagnosis not present

## 2024-08-31 DIAGNOSIS — E785 Hyperlipidemia, unspecified: Secondary | ICD-10-CM | POA: Diagnosis not present

## 2024-08-31 DIAGNOSIS — I10 Essential (primary) hypertension: Secondary | ICD-10-CM | POA: Diagnosis not present

## 2024-08-31 DIAGNOSIS — E038 Other specified hypothyroidism: Secondary | ICD-10-CM | POA: Diagnosis not present

## 2024-08-31 DIAGNOSIS — N400 Enlarged prostate without lower urinary tract symptoms: Secondary | ICD-10-CM | POA: Diagnosis not present

## 2024-09-04 DIAGNOSIS — H16223 Keratoconjunctivitis sicca, not specified as Sjogren's, bilateral: Secondary | ICD-10-CM | POA: Diagnosis not present

## 2024-09-04 DIAGNOSIS — H43811 Vitreous degeneration, right eye: Secondary | ICD-10-CM | POA: Diagnosis not present

## 2024-09-04 DIAGNOSIS — H401132 Primary open-angle glaucoma, bilateral, moderate stage: Secondary | ICD-10-CM | POA: Diagnosis not present

## 2024-09-07 DIAGNOSIS — Z1389 Encounter for screening for other disorder: Secondary | ICD-10-CM | POA: Diagnosis not present

## 2024-09-07 DIAGNOSIS — N400 Enlarged prostate without lower urinary tract symptoms: Secondary | ICD-10-CM | POA: Diagnosis not present

## 2024-09-07 DIAGNOSIS — Z1331 Encounter for screening for depression: Secondary | ICD-10-CM | POA: Diagnosis not present

## 2024-09-07 DIAGNOSIS — E038 Other specified hypothyroidism: Secondary | ICD-10-CM | POA: Diagnosis not present

## 2024-09-07 DIAGNOSIS — E785 Hyperlipidemia, unspecified: Secondary | ICD-10-CM | POA: Diagnosis not present

## 2024-09-07 DIAGNOSIS — I10 Essential (primary) hypertension: Secondary | ICD-10-CM | POA: Diagnosis not present

## 2024-09-07 DIAGNOSIS — Z23 Encounter for immunization: Secondary | ICD-10-CM | POA: Diagnosis not present

## 2024-09-21 ENCOUNTER — Other Ambulatory Visit: Payer: Medicare Other

## 2024-09-21 DIAGNOSIS — R972 Elevated prostate specific antigen [PSA]: Secondary | ICD-10-CM

## 2024-09-22 LAB — PSA, TOTAL AND FREE
PSA, Free Pct: 23.8 %
PSA, Free: 0.31 ng/mL
Prostate Specific Ag, Serum: 1.3 ng/mL (ref 0.0–4.0)

## 2024-09-24 ENCOUNTER — Ambulatory Visit: Payer: Self-pay

## 2024-09-27 ENCOUNTER — Ambulatory Visit: Payer: Medicare Other | Admitting: Urology

## 2024-10-01 ENCOUNTER — Ambulatory Visit: Admitting: Urology

## 2024-10-01 VITALS — BP 152/84 | HR 57

## 2024-10-01 DIAGNOSIS — R339 Retention of urine, unspecified: Secondary | ICD-10-CM

## 2024-10-01 DIAGNOSIS — R338 Other retention of urine: Secondary | ICD-10-CM | POA: Diagnosis not present

## 2024-10-01 DIAGNOSIS — N401 Enlarged prostate with lower urinary tract symptoms: Secondary | ICD-10-CM | POA: Diagnosis not present

## 2024-10-01 DIAGNOSIS — R351 Nocturia: Secondary | ICD-10-CM | POA: Diagnosis not present

## 2024-10-01 DIAGNOSIS — N138 Other obstructive and reflux uropathy: Secondary | ICD-10-CM | POA: Diagnosis not present

## 2024-10-01 DIAGNOSIS — R972 Elevated prostate specific antigen [PSA]: Secondary | ICD-10-CM

## 2024-10-01 DIAGNOSIS — R3912 Poor urinary stream: Secondary | ICD-10-CM

## 2024-10-01 LAB — URINALYSIS, ROUTINE W REFLEX MICROSCOPIC
Bilirubin, UA: NEGATIVE
Glucose, UA: NEGATIVE
Ketones, UA: NEGATIVE
Nitrite, UA: NEGATIVE
Protein,UA: NEGATIVE
RBC, UA: NEGATIVE
Specific Gravity, UA: <1.005 — ABNORMAL LOW (ref 1.005–1.030)
Urobilinogen, Ur: 0.2 mg/dL (ref 0.2–1.0)
pH, UA: 6 (ref 5.0–7.5)

## 2024-10-01 LAB — MICROSCOPIC EXAMINATION

## 2024-10-01 MED ORDER — FINASTERIDE 5 MG PO TABS: 5.0000 mg | ORAL_TABLET | Freq: Every day | ORAL | 3 refills | Status: AC

## 2024-10-01 MED ORDER — TAMSULOSIN HCL 0.4 MG PO CAPS: 0.4000 mg | ORAL_CAPSULE | Freq: Every day | ORAL | 3 refills | Status: AC

## 2024-10-01 NOTE — Progress Notes (Signed)
 10/01/2024 11:56 AM   Hector Hock Jr. 07/24/1946 [id number]  Referring provider: Carlette Benita Area, MD [address] [place],  [place] [address]  No chief complaint on file.   HPI: A new patient for me-reviewed prior notes, labs.  1) BPH - on tamsulosin  and finasteride .  IPSS 6.  Nocturia x 2.  PVR 131.  Added finasteride  due to recurrent UTI and incomplete bladder emptying (PVR greater than 300).  Uroflow 4 cc/s.  Was on cephalexin  suppression in 2023.  2) PSA elevation -prior PSA 4.6-7.6. On finasteride  since 2022. PSA dropped to 3.9.  PSA dropped further to 2.7.    3) ED-not interested in further therapy.  Today, seen for the above.  He continues tamsulosin  and finasteride . Dec 2025 PSA 1.3 (2.6). IPSS 8-11. Noc x 0-3.   PMH: Past Medical History:  Diagnosis Date   Astigmatism    BPH (benign prostatic hyperplasia)    Cataracts, bilateral    Cervicalgia    Cervicalgia    Chest pain, unspecified    Essential (primary) hypertension    Gastro-esophageal reflux disease with esophagitis    GERD (gastroesophageal reflux disease)    Glaucoma    Hyperlipidemia, unspecified    Hypertension    Mini stroke 12/2017   Nontoxic multinodular goiter    Osteoarthritis    Other obstructive and reflux uropathy    Pain in joint involving shoulder region    Pain in right ankle and joints of right foot    Thyroid  goiter    Transient cerebral ischemic attack, unspecified    Unspecified glaucoma     Surgical History: Past Surgical History:  Procedure Laterality Date   APPENDECTOMY  [facility], 60's   CATARACT EXTRACTION W/PHACO Right 01/28/2023   Procedure: CATARACT EXTRACTION PHACO AND INTRAOCULAR LENS PLACEMENT (IOC);  Surgeon: Juli Blunt, MD;  Location: [facility];  Service: Ophthalmology;  Laterality: Right;  CDE: 7.66   CATARACT EXTRACTION W/PHACO Left 02/11/2023   Procedure: CATARACT EXTRACTION PHACO AND INTRAOCULAR LENS PLACEMENT (IOC);  Surgeon: Juli Blunt,  MD;  Location: [facility];  Service: Ophthalmology;  Laterality: Left;  CDE: 6.69   COLONOSCOPY N/A 12/05/2012   Procedure: COLONOSCOPY;  Surgeon: Lamar CHRISTELLA Hollingshead, MD;  Location: [facility];  Service: Endoscopy;  Laterality: N/A;  1:00 PM   DENTAL SURGERY     THYROIDECTOMY  07/17/2018   THYROIDECTOMY N/A 07/17/2018   Procedure: TOTAL THYROIDECTOMY;  Surgeon: Eletha Boas, MD;  Location: [facility];  Service: General;  Laterality: N/A;    Home Medications:  Allergies as of 10/01/2024   No Known Allergies      Medication List        Accurate as of October 01, 2024 11:56 AM. If you have any questions, ask your nurse or doctor.          atenolol 50 MG tablet Commonly known as: TENORMIN Take 50 mg by mouth daily.   atorvastatin 20 MG tablet Commonly known as: LIPITOR Take 20 mg by mouth daily.   finasteride  5 MG tablet Commonly known as: PROSCAR  Take 1 tablet (5 mg total) by mouth daily.   levothyroxine  75 MCG tablet Commonly known as: SYNTHROID  Take 75 mcg by mouth every morning.   losartan 100 MG tablet Commonly known as: COZAAR Take 100 mg by mouth daily.   tamsulosin  0.4 MG Caps capsule Commonly known as: FLOMAX  Take 1 capsule (0.4 mg total) by mouth daily.        Allergies: Allergies[1]  Family History:  Family History  Problem Relation Age of Onset   Cancer Mother    Cancer Father    Cancer Sister    Cancer Brother     Social History:  reports that he quit smoking about 26 years ago. His smoking use included cigarettes. He started smoking about 29 years ago. He has a 1.5 pack-year smoking history. He has never used smokeless tobacco. He reports that he does not drink alcohol  and does not use drugs.   Physical Exam: There were no vitals taken for this visit.  Constitutional:  Alert and oriented, No acute distress. HEENT: [place] AT, moist mucus membranes.  Trachea midline, no masses. Cardiovascular: No clubbing, cyanosis, or edema. Respiratory: Normal  respiratory effort, no increased work of breathing. GI: Abdomen is soft, nontender, nondistended, no abdominal masses GU: No CVA tenderness Skin: No rashes, bruises or suspicious lesions. Neurologic: Grossly intact, no focal deficits, moving all 4 extremities. Psychiatric: Normal mood and affect.  Laboratory Data: Lab Results  Component Value Date   WBC 3.6 05/07/2019   HGB 12.9 (A) 05/07/2019   HCT 38 (A) 05/07/2019   MCV 99.5 07/17/2018   PLT 142 (A) 05/07/2019    Lab Results  Component Value Date   CREATININE 1.15 11/27/2019    Lab Results  Component Value Date   PSA 3.3 05/07/2019    No results found for: TESTOSTERONE  No results found for: HGBA1C  Urinalysis    Component Value Date/Time   APPEARANCEUR Clear 09/22/2023 1121   GLUCOSEU Negative 09/22/2023 1121   BILIRUBINUR Negative 09/22/2023 1121   PROTEINUR Negative 09/22/2023 1121   NITRITE Negative 09/22/2023 1121   LEUKOCYTESUR Trace (A) 09/22/2023 1121    Lab Results  Component Value Date   LABMICR See below: 09/22/2023   WBCUA 0-5 09/22/2023   LABEPIT 0-10 09/22/2023   MUCUS Present (A) 08/18/2022   BACTERIA Many (A) 09/22/2023    Pertinent Imaging: N/a   Assessment & Plan:    BPH-we went over the nature risk benefits and alternatives to tamsulosin  and finasteride .  Discussed FDA warnings among others.  Also discussed procedures.  Elevated PSA-resolved.  PSA has normalized on finasteride .  No follow-ups on file.  Donnice Brooks, MD  [facility]  [address] [place], [place] [address] [phone]      [1] No Known Allergies

## 2025-08-26 ENCOUNTER — Other Ambulatory Visit

## 2025-09-02 ENCOUNTER — Ambulatory Visit: Admitting: Urology
